# Patient Record
Sex: Female | Born: 1970 | Race: White | Hispanic: No | Smoking: Never smoker
Health system: Southern US, Community
[De-identification: ages and names within clinical notes are randomized; demographics above are authoritative.]

## PROBLEM LIST (undated history)

## (undated) DIAGNOSIS — F329 Major depressive disorder, single episode, unspecified: Secondary | ICD-10-CM

## (undated) DIAGNOSIS — F411 Generalized anxiety disorder: Secondary | ICD-10-CM

## (undated) HISTORY — DX: Major depressive disorder, single episode, unspecified: F32.9

## (undated) HISTORY — PX: OTHER SURGICAL HISTORY: SHX169

## (undated) HISTORY — DX: Generalized anxiety disorder: F41.1

---

## 2012-05-29 LAB — HM MAMMOGRAPHY: HM Mammogram: NORMAL

## 2012-05-29 LAB — HM PAP SMEAR: HM Pap smear: NORMAL

## 2014-04-07 ENCOUNTER — Ambulatory Visit: Payer: Self-pay | Admitting: Family Medicine

## 2014-04-18 ENCOUNTER — Encounter: Payer: Self-pay | Admitting: Medical

## 2014-04-18 ENCOUNTER — Ambulatory Visit (INDEPENDENT_AMBULATORY_CARE_PROVIDER_SITE_OTHER): Payer: BC Managed Care – PPO | Admitting: Medical

## 2014-04-18 VITALS — BP 92/62 | HR 67 | Temp 98.3°F | Ht 66.5 in | Wt 156.6 lb

## 2014-04-18 DIAGNOSIS — F418 Other specified anxiety disorders: Secondary | ICD-10-CM | POA: Insufficient documentation

## 2014-04-18 DIAGNOSIS — F32A Depression, unspecified: Secondary | ICD-10-CM

## 2014-04-18 DIAGNOSIS — IMO0002 Reserved for concepts with insufficient information to code with codable children: Secondary | ICD-10-CM

## 2014-04-18 DIAGNOSIS — R5381 Other malaise: Secondary | ICD-10-CM

## 2014-04-18 DIAGNOSIS — R5383 Other fatigue: Secondary | ICD-10-CM | POA: Insufficient documentation

## 2014-04-18 DIAGNOSIS — F329 Major depressive disorder, single episode, unspecified: Secondary | ICD-10-CM | POA: Insufficient documentation

## 2014-04-18 DIAGNOSIS — F3289 Other specified depressive episodes: Secondary | ICD-10-CM

## 2014-04-18 DIAGNOSIS — M5410 Radiculopathy, site unspecified: Secondary | ICD-10-CM

## 2014-04-18 HISTORY — DX: Depression, unspecified: F32.A

## 2014-04-18 LAB — CBC WITH DIFFERENTIAL/PLATELET
BASOS ABS: 0 10*3/uL (ref 0.0–0.1)
Basophils Relative: 0.4 % (ref 0.0–3.0)
EOS ABS: 0.2 10*3/uL (ref 0.0–0.7)
Eosinophils Relative: 3.6 % (ref 0.0–5.0)
HCT: 38.5 % (ref 36.0–46.0)
HEMOGLOBIN: 12.8 g/dL (ref 12.0–15.0)
Lymphocytes Relative: 26.2 % (ref 12.0–46.0)
Lymphs Abs: 1.3 10*3/uL (ref 0.7–4.0)
MCHC: 33.3 g/dL (ref 30.0–36.0)
MCV: 90.7 fl (ref 78.0–100.0)
MONOS PCT: 6.7 % (ref 3.0–12.0)
Monocytes Absolute: 0.3 10*3/uL (ref 0.1–1.0)
NEUTROS ABS: 3.1 10*3/uL (ref 1.4–7.7)
NEUTROS PCT: 63.1 % (ref 43.0–77.0)
Platelets: 222 10*3/uL (ref 150.0–400.0)
RBC: 4.25 Mil/uL (ref 3.87–5.11)
RDW: 12.8 % (ref 11.5–15.5)
WBC: 4.9 10*3/uL (ref 4.0–10.5)

## 2014-04-18 LAB — COMPLETE METABOLIC PANEL WITH GFR
ALBUMIN: 4.3 g/dL (ref 3.5–5.2)
ALK PHOS: 34 U/L — AB (ref 39–117)
ALT: 8 U/L (ref 0–35)
AST: 13 U/L (ref 0–37)
BUN: 10 mg/dL (ref 6–23)
CALCIUM: 9.3 mg/dL (ref 8.4–10.5)
CHLORIDE: 101 meq/L (ref 96–112)
CO2: 26 mEq/L (ref 19–32)
Creat: 0.56 mg/dL (ref 0.50–1.10)
GFR, Est African American: >89 mL/min
GFR, Est Non African American: >89 mL/min
Glucose, Bld: 82 mg/dL (ref 70–99)
Potassium: 4.2 mEq/L (ref 3.5–5.3)
SODIUM: 136 meq/L (ref 135–145)
TOTAL PROTEIN: 6.5 g/dL (ref 6.0–8.3)
Total Bilirubin: 0.6 mg/dL (ref 0.2–1.2)

## 2014-04-18 LAB — TSH: TSH: 0.76 u[IU]/mL (ref 0.35–4.50)

## 2014-04-18 NOTE — Patient Instructions (Signed)
For your fatigue we will do cbc, cmp and tsh today. For your mood if you continue to feel depression could offer medication and could investigate into counseling services. For your rare transient radicular(shooting pain) left upper and lower extremity pain we could do imaging studies, labs or refer to neurologist depending on associated signs or symptoms. If symptoms change or worsen notify us as that would direct our workup. Follow up in 1-2 months for cpe with Dr. Beverely Low.

## 2014-04-18 NOTE — Assessment & Plan Note (Signed)
Will go ahead and get CBC, CMP and TSH today. This is patient's first time and will see if she is anemic. We'll follow her blood pressure on future visits as well. She is not report any heavy vaginal bleeding or any dark black stools. It is possible her blood pressure is her baseline normal although she reports at her gym her blood pressure is a little bit higher.

## 2014-04-18 NOTE — Progress Notes (Signed)
Subjective:    Patient ID: Danielle Kelly, female    DOB: [birth year], 43 y.o.   MRN: [id number]  HPI  See pmh, psh, fh.  Pt here to switch from [facility]. Pt drinks 2 cups coffee a day. Exercise twice a week. Now walking. Not married. 2 boys. 1 in college. Other in 8th grade. [employer]. 2 yrs [employer] and business school. Pt states her last papsmear has been a while.   Pt states she does feel some level depression. Dad passed awary Jan 19, 2023. Aunt passed away about 2 wks later. Son just left for college. Pt feels sad cries every day. Sleeps about 7 hours. Can't concentrate. No homicidal or suicidal ideations. This is first significant loss. Pt does report some fatigue. Last complete physical more than 1 yr(She thinks.).  Pt also states occasional transient shooting pain on and off lt upper extremity and lt lower extremtiy. Brief last about 3 seconds now.  But states in may was more constant  Sharp pain  off and on for week and around April.  No chest pain, neck pain or shoulder pain. Prior pcp evaluated and thought anxiety related. Her ekg around that time was negative.  Fatigue x 3 months.    Review of Systems  Constitutional: Positive for fatigue. Negative for fever and chills.  HENT: Negative.   Respiratory: Negative for cough, choking, chest tightness and wheezing.   Cardiovascular: Negative for chest pain, palpitations and leg swelling.  Endocrine: Negative for polydipsia and polyphagia.  Genitourinary: Negative.   Neurological: Negative.        Only rare occasional sharp transient pain in left upper extremity and lower extremity. On review she has no neck pain or lumbar pain. No weakness of either extremity. No report of incontinence. No associated neurologic type symptoms. No chest pain with this transient shooting pain. Her EKG done was negative at her prior PCP office. Recently she states that she has not been an issue but she did bring up at the end  of the interview.   Psychiatric/Behavioral: Positive for dysphoric mood. Negative for suicidal ideas, hallucinations, behavioral problems, confusion, sleep disturbance, self-injury and decreased concentration. The patient is not nervous/anxious.        She does admit to some depression but does not want to be on any medications. She thinks this is somewhat normal variant due to the death of her father. As well as other family members passing and her son leaving for college. Her mild depression has been present since May.       Objective:   Physical Exam  Constitutional: She is oriented to person, place, and time. She appears well-developed and well-nourished. No distress.  HENT:  Head: Normocephalic and atraumatic.  Eyes: Conjunctivae and EOM are normal. Pupils are equal, round, and reactive to light.  Neck: Normal range of motion. Neck supple. No tracheal deviation present. No thyromegaly present.  Cardiovascular: Normal rate, regular rhythm and normal heart sounds.   Pulmonary/Chest: Effort normal and breath sounds normal. No stridor. No respiratory distress. She has no wheezes. She has no rales. She exhibits no tenderness.  Abdominal: She exhibits no distension and no mass. There is no tenderness. There is no rebound and no guarding.  Musculoskeletal:  Left upper extremity-negative Phalen's and Finkelstein test. She exhibits good range of motion of the shoulder and elbow with no pain. Radial and ulnar pulse intact.  Lower back-no mid line lumbar tenderness to palpation. No CVA tenderness.  Lymphadenopathy:  She has no cervical adenopathy.  Neurological: She is alert and oriented to person, place, and time. No cranial nerve deficit. Coordination normal.  Skin: Skin is warm and dry. She is not diaphoretic. No erythema. No pallor.  Psychiatric: She has a normal mood and affect. Her behavior is normal. Judgment and thought content normal.  No flat affect. Pleasant today and does not come  across as overly depressed.            Assessment & Plan:

## 2014-04-18 NOTE — Assessment & Plan Note (Signed)
The patient describes somewhat atypical sharp transient pain in the left upper extremity and left lower extremity. This was more prominent in April and May. Has subsided a great deal and is very rare now. Presently I want patient to monitor frequency duration and associated signs and symptoms. Would consider workup depending on how this progresses. Recently it has been very minimal/rare.

## 2014-04-18 NOTE — Assessment & Plan Note (Signed)
Patient first visit here and she is not open to the idea of medication today. I did advise her on the length of her decreased mood from May until now. I expressed to her that that was good length of time and her depressed mood were to continue I think a good idea would be short course of low dose SSRI. In addition if this persists would offer referral for counseling.

## 2014-05-29 ENCOUNTER — Encounter: Payer: Self-pay | Admitting: Family Medicine

## 2014-05-29 ENCOUNTER — Ambulatory Visit (INDEPENDENT_AMBULATORY_CARE_PROVIDER_SITE_OTHER): Payer: BC Managed Care – PPO | Admitting: Family Medicine

## 2014-05-29 VITALS — BP 120/80 | HR 64 | Temp 98.1°F | Resp 16 | Wt 161.1 lb

## 2014-05-29 DIAGNOSIS — F32A Depression, unspecified: Secondary | ICD-10-CM

## 2014-05-29 DIAGNOSIS — R253 Fasciculation: Secondary | ICD-10-CM

## 2014-05-29 DIAGNOSIS — F329 Major depressive disorder, single episode, unspecified: Secondary | ICD-10-CM

## 2014-05-29 NOTE — Progress Notes (Signed)
   Subjective:    Patient ID: Danielle Kelly, female    DOB: [birth year], 43 y.o.   MRN: [id number]  HPI Depression- pt lost father, aunt, and oldest son left for college all this summer.  Pt [facility].  Pt is not interested in idea of medication.  Pt largest stressor was dealing w/ mom after dad passed.  Pt has very busy schedule.  'i want to grieve and be done w/ it'.  Pt reports things are improving, 'i'm just super sad'.  Pt reports she is able to get through her day and get done what she needs to get done.  'it annoys me that I cry so much'.  L eye spasm- pt reports that 'it pulls like it's closing'.  Will occur on both upper and lower lid.  'feels like it's getting pulled back and then releases'.  sxs will occur multiple times daily, lasting briefly for a few seconds.  Review of Systems For ROS see HPI     Objective:   Physical Exam  Vitals reviewed. Constitutional: She is oriented to person, place, and time. She appears well-developed and well-nourished. No distress.  HENT:  Head: Normocephalic and atraumatic.  Eyes: Conjunctivae and EOM are normal. Pupils are equal, round, and reactive to light. Right eye exhibits no discharge. Left eye exhibits no discharge.  Cardiovascular: Normal rate, regular rhythm, normal heart sounds and intact distal pulses.   Pulmonary/Chest: Effort normal and breath sounds normal. No respiratory distress. She has no wheezes. She has no rales.  Neurological: She is alert and oriented to person, place, and time. No cranial nerve deficit. Coordination normal.  Psychiatric: She has a normal mood and affect. Her behavior is normal. Thought content normal.          Assessment & Plan:

## 2014-05-29 NOTE — Assessment & Plan Note (Signed)
New.  Suspect this is stress related.  Encouraged pt to find stress outlet and rest.  Reviewed recent labs- WNL.  No need for additional testing at this time.

## 2014-05-29 NOTE — Assessment & Plan Note (Signed)
Pt reports doing better since last visit.  Still going through the grieving process.  Not interested in medication.  Doesn't want to continue grief counseling at this time.  Reviewed that grief itself is not pathologic but she needs to be aware that if bad days outnumber good or grief is interfering w/ daily function, we should consider meds.  Pt expressed understanding and is in agreement w/ plan.

## 2014-05-29 NOTE — Progress Notes (Signed)
Pre visit review using our clinic review tool, if applicable. No additional management support is needed unless otherwise documented below in the visit note. 

## 2014-05-29 NOTE — Patient Instructions (Signed)
Follow up as scheduled for your physical Grief is a normal process and can take up to a year to fully navigate- this is normal! If you find that you are having more bad days than good days or the sadness is interfering w/ your day to day functioning, please call me! Call with any questions or concerns Hang in there!  You're stronger than you give yourself credit for!

## 2014-07-26 ENCOUNTER — Encounter: Payer: BC Managed Care – PPO | Admitting: Family Medicine

## 2015-05-29 ENCOUNTER — Ambulatory Visit (INDEPENDENT_AMBULATORY_CARE_PROVIDER_SITE_OTHER): Payer: BLUE CROSS/BLUE SHIELD | Admitting: Family Medicine

## 2015-05-29 ENCOUNTER — Encounter: Payer: Self-pay | Admitting: Family Medicine

## 2015-05-29 VITALS — BP 122/82 | HR 65 | Temp 98.5°F | Resp 18 | Ht 66.5 in | Wt 164.0 lb

## 2015-05-29 DIAGNOSIS — M792 Neuralgia and neuritis, unspecified: Secondary | ICD-10-CM

## 2015-05-29 DIAGNOSIS — F411 Generalized anxiety disorder: Secondary | ICD-10-CM | POA: Insufficient documentation

## 2015-05-29 DIAGNOSIS — Z1239 Encounter for other screening for malignant neoplasm of breast: Secondary | ICD-10-CM | POA: Diagnosis not present

## 2015-05-29 DIAGNOSIS — R079 Chest pain, unspecified: Secondary | ICD-10-CM | POA: Insufficient documentation

## 2015-05-29 DIAGNOSIS — R5383 Other fatigue: Secondary | ICD-10-CM | POA: Diagnosis not present

## 2015-05-29 DIAGNOSIS — Z0000 Encounter for general adult medical examination without abnormal findings: Secondary | ICD-10-CM | POA: Insufficient documentation

## 2015-05-29 DIAGNOSIS — F329 Major depressive disorder, single episode, unspecified: Secondary | ICD-10-CM

## 2015-05-29 DIAGNOSIS — M5410 Radiculopathy, site unspecified: Secondary | ICD-10-CM

## 2015-05-29 DIAGNOSIS — R0789 Other chest pain: Secondary | ICD-10-CM | POA: Diagnosis not present

## 2015-05-29 DIAGNOSIS — F32A Depression, unspecified: Secondary | ICD-10-CM

## 2015-05-29 HISTORY — DX: Generalized anxiety disorder: F41.1

## 2015-05-29 HISTORY — DX: Chest pain, unspecified: R07.9

## 2015-05-29 LAB — CBC WITH DIFFERENTIAL/PLATELET
BASOS PCT: 0.5 % (ref 0.0–3.0)
Basophils Absolute: 0 10*3/uL (ref 0.0–0.1)
EOS ABS: 0.2 10*3/uL (ref 0.0–0.7)
Eosinophils Relative: 2.9 % (ref 0.0–5.0)
HEMATOCRIT: 40.4 % (ref 36.0–46.0)
Hemoglobin: 13.4 g/dL (ref 12.0–15.0)
LYMPHS ABS: 1.8 10*3/uL (ref 0.7–4.0)
LYMPHS PCT: 26.5 % (ref 12.0–46.0)
MCHC: 33.2 g/dL (ref 30.0–36.0)
MCV: 90.5 fl (ref 78.0–100.0)
MONO ABS: 0.4 10*3/uL (ref 0.1–1.0)
Monocytes Relative: 6.1 % (ref 3.0–12.0)
NEUTROS ABS: 4.4 10*3/uL (ref 1.4–7.7)
Neutrophils Relative %: 64 % (ref 43.0–77.0)
PLATELETS: 256 10*3/uL (ref 150.0–400.0)
RBC: 4.47 Mil/uL (ref 3.87–5.11)
RDW: 12.9 % (ref 11.5–15.5)
WBC: 6.9 10*3/uL (ref 4.0–10.5)

## 2015-05-29 LAB — COMPREHENSIVE METABOLIC PANEL
ALT: 10 U/L (ref 0–35)
AST: 13 U/L (ref 0–37)
Albumin: 4.5 g/dL (ref 3.5–5.2)
Alkaline Phosphatase: 42 U/L (ref 39–117)
BUN: 12 mg/dL (ref 6–23)
CALCIUM: 9.7 mg/dL (ref 8.4–10.5)
CHLORIDE: 102 meq/L (ref 96–112)
CO2: 31 meq/L (ref 19–32)
CREATININE: 0.49 mg/dL (ref 0.40–1.20)
GFR: 145.66 mL/min (ref >60.00)
Glucose, Bld: 86 mg/dL (ref 70–99)
Potassium: 3.9 mEq/L (ref 3.5–5.1)
Sodium: 139 mEq/L (ref 135–145)
Total Bilirubin: 0.6 mg/dL (ref 0.2–1.2)
Total Protein: 7.1 g/dL (ref 6.0–8.3)

## 2015-05-29 LAB — TSH: TSH: 1.25 u[IU]/mL (ref 0.35–4.50)

## 2015-05-29 LAB — VITAMIN D 25 HYDROXY (VIT D DEFICIENCY, FRACTURES): VITD: 15.47 ng/mL — ABNORMAL LOW (ref 30.00–100.00)

## 2015-05-29 LAB — VITAMIN B12: Vitamin B-12: 446 pg/mL (ref 211–911)

## 2015-05-29 MED ORDER — LORAZEPAM 0.5 MG PO TABS
0.5000 mg | ORAL_TABLET | Freq: Two times a day (BID) | ORAL | Status: DC | PRN
Start: 2015-05-29 — End: 2018-01-14

## 2015-05-29 NOTE — Progress Notes (Signed)
Pre visit review using our clinic review tool, if applicable. No additional management support is needed unless otherwise documented below in the visit note. 

## 2015-05-29 NOTE — Patient Instructions (Signed)
Health Maintenance, Female Adopting a healthy lifestyle and getting preventive care can go a long way to promote health and wellness. Talk with your health care provider about what schedule of regular examinations is right for you. This is a good chance for you to check in with your provider about disease prevention and staying healthy. In between checkups, there are plenty of things you can do on your own. Experts have done a lot of research about which lifestyle changes and preventive measures are most likely to keep you healthy. Ask your health care provider for more information. WEIGHT AND DIET  Eat a healthy diet  Be sure to include plenty of vegetables, fruits, low-fat dairy products, and lean protein.  Do not eat a lot of foods high in solid fats, added sugars, or salt.  Get regular exercise. This is one of the most important things you can do for your health.  Most adults should exercise for at least 150 minutes each week. The exercise should increase your heart rate and make you sweat (moderate-intensity exercise).  Most adults should also do strengthening exercises at least twice a week. This is in addition to the moderate-intensity exercise.  Maintain a healthy weight  Body mass index (BMI) is a measurement that can be used to identify possible weight problems. It estimates body fat based on height and weight. Your health care provider can help determine your BMI and help you achieve or maintain a healthy weight.  For females 31 years of age and older:   A BMI below 18.5 is considered underweight.  A BMI of 18.5 to 24.9 is normal.  A BMI of 25 to 29.9 is considered overweight.  A BMI of 30 and above is considered obese.  Watch levels of cholesterol and blood lipids  You should start having your blood tested for lipids and cholesterol at 44 years of age, then have this test every 5 years.  You may need to have your cholesterol levels checked more often if:  Your lipid  or cholesterol levels are high.  You are older than 44 years of age.  You are at high risk for heart disease.  CANCER SCREENING   Lung Cancer  Lung cancer screening is recommended for adults 28-85 years old who are at high risk for lung cancer because of a history of smoking.  A yearly low-dose CT scan of the lungs is recommended for people who:  Currently smoke.  Have quit within the past 15 years.  Have at least a 30-pack-year history of smoking. A pack year is smoking an average of one pack of cigarettes a day for 1 year.  Yearly screening should continue until it has been 15 years since you quit.  Yearly screening should stop if you develop a health problem that would prevent you from having lung cancer treatment.  Breast Cancer  Practice breast self-awareness. This means understanding how your breasts normally appear and feel.  It also means doing regular breast self-exams. Let your health care provider know about any changes, no matter how small.  If you are in your 20s or 30s, you should have a clinical breast exam (CBE) by a health care provider every 1-3 years as part of a regular health exam.  If you are 103 or older, have a CBE every year. Also consider having a breast X-ray (mammogram) every year.  If you have a family history of breast cancer, talk to your health care provider about genetic screening.  If you  are at high risk for breast cancer, talk to your health care provider about having an MRI and a mammogram every year.  Breast cancer gene (BRCA) assessment is recommended for women who have family members with BRCA-related cancers. BRCA-related cancers include:  Breast.  Ovarian.  Tubal.  Peritoneal cancers.  Results of the assessment will determine the need for genetic counseling and BRCA1 and BRCA2 testing. Cervical Cancer Your health care provider may recommend that you be screened regularly for cancer of the pelvic organs (ovaries, uterus, and  vagina). This screening involves a pelvic examination, including checking for microscopic changes to the surface of your cervix (Pap test). You may be encouraged to have this screening done every 3 years, beginning at age 21.  For women ages 30-65, health care providers may recommend pelvic exams and Pap testing every 3 years, or they may recommend the Pap and pelvic exam, combined with testing for human papilloma virus (HPV), every 5 years. Some types of HPV increase your risk of cervical cancer. Testing for HPV may also be done on women of any age with unclear Pap test results.  Other health care providers may not recommend any screening for nonpregnant women who are considered low risk for pelvic cancer and who do not have symptoms. Ask your health care provider if a screening pelvic exam is right for you.  If you have had past treatment for cervical cancer or a condition that could lead to cancer, you need Pap tests and screening for cancer for at least 20 years after your treatment. If Pap tests have been discontinued, your risk factors (such as having a new sexual partner) need to be reassessed to determine if screening should resume. Some women have medical problems that increase the chance of getting cervical cancer. In these cases, your health care provider may recommend more frequent screening and Pap tests. Colorectal Cancer  This type of cancer can be detected and often prevented.  Routine colorectal cancer screening usually begins at 44 years of age and continues through 44 years of age.  Your health care provider may recommend screening at an earlier age if you have risk factors for colon cancer.  Your health care provider may also recommend using home test kits to check for hidden blood in the stool.  A small camera at the end of a tube can be used to examine your colon directly (sigmoidoscopy or colonoscopy). This is done to check for the earliest forms of colorectal  cancer.  Routine screening usually begins at age 50.  Direct examination of the colon should be repeated every 5-10 years through 44 years of age. However, you may need to be screened more often if early forms of precancerous polyps or small growths are found. Skin Cancer  Check your skin from head to toe regularly.  Tell your health care provider about any new moles or changes in moles, especially if there is a change in a mole's shape or color.  Also tell your health care provider if you have a mole that is larger than the size of a pencil eraser.  Always use sunscreen. Apply sunscreen liberally and repeatedly throughout the day.  Protect yourself by wearing long sleeves, pants, a wide-brimmed hat, and sunglasses whenever you are outside. HEART DISEASE, DIABETES, AND HIGH BLOOD PRESSURE   High blood pressure causes heart disease and increases the risk of stroke. High blood pressure is more likely to develop in:  People who have blood pressure in the high end   of the normal range (130-139/85-89 mm Hg).  People who are overweight or obese.  People who are African American.  If you are 38-23 years of age, have your blood pressure checked every 3-5 years. If you are 61 years of age or older, have your blood pressure checked every year. You should have your blood pressure measured twice--once when you are at a hospital or clinic, and once when you are not at a hospital or clinic. Record the average of the two measurements. To check your blood pressure when you are not at a hospital or clinic, you can use:  An automated blood pressure machine at a pharmacy.  A home blood pressure monitor.  If you are between 45 years and 39 years old, ask your health care provider if you should take aspirin to prevent strokes.  Have regular diabetes screenings. This involves taking a blood sample to check your fasting blood sugar level.  If you are at a normal weight and have a low risk for diabetes,  have this test once every three years after 44 years of age.  If you are overweight and have a high risk for diabetes, consider being tested at a younger age or more often. PREVENTING INFECTION  Hepatitis B  If you have a higher risk for hepatitis B, you should be screened for this virus. You are considered at high risk for hepatitis B if:  You were born in a country where hepatitis B is common. Ask your health care provider which countries are considered high risk.  Your parents were born in a high-risk country, and you have not been immunized against hepatitis B (hepatitis B vaccine).  You have HIV or AIDS.  You use needles to inject street drugs.  You live with someone who has hepatitis B.  You have had sex with someone who has hepatitis B.  You get hemodialysis treatment.  You take certain medicines for conditions, including cancer, organ transplantation, and autoimmune conditions. Hepatitis C  Blood testing is recommended for:  Everyone born from [birth year] through 1965.  Anyone with known risk factors for hepatitis C. Sexually transmitted infections (STIs)  You should be screened for sexually transmitted infections (STIs) including gonorrhea and chlamydia if:  You are sexually active and are younger than 44 years of age.  You are older than 44 years of age and your health care provider tells you that you are at risk for this type of infection.  Your sexual activity has changed since you were last screened and you are at an increased risk for chlamydia or gonorrhea. Ask your health care provider if you are at risk.  If you do not have HIV, but are at risk, it may be recommended that you take a prescription medicine daily to prevent HIV infection. This is called pre-exposure prophylaxis (PrEP). You are considered at risk if:  You are sexually active and do not regularly use condoms or know the HIV status of your partner(s).  You take drugs by injection.  You are sexually  active with a partner who has HIV. Talk with your health care provider about whether you are at high risk of being infected with HIV. If you choose to begin PrEP, you should first be tested for HIV. You should then be tested every 3 months for as long as you are taking PrEP.  PREGNANCY   If you are premenopausal and you may become pregnant, ask your health care provider about preconception counseling.  If you may  become pregnant, take 400 to 800 micrograms (mcg) of folic acid every day.  If you want to prevent pregnancy, talk to your health care provider about birth control (contraception). OSTEOPOROSIS AND MENOPAUSE   Osteoporosis is a disease in which the bones lose minerals and strength with aging. This can result in serious bone fractures. Your risk for osteoporosis can be identified using a bone density scan.  If you are 61 years of age or older, or if you are at risk for osteoporosis and fractures, ask your health care provider if you should be screened.  Ask your health care provider whether you should take a calcium or vitamin D supplement to lower your risk for osteoporosis.  Menopause may have certain physical symptoms and risks.  Hormone replacement therapy may reduce some of these symptoms and risks. Talk to your health care provider about whether hormone replacement therapy is right for you.  HOME CARE INSTRUCTIONS   Schedule regular health, dental, and eye exams.  Stay current with your immunizations.   Do not use any tobacco products including cigarettes, chewing tobacco, or electronic cigarettes.  If you are pregnant, do not drink alcohol.  If you are breastfeeding, limit how much and how often you drink alcohol.  Limit alcohol intake to no more than 1 drink per day for nonpregnant women. One drink equals 12 ounces of beer, 5 ounces of wine, or 1 ounces of hard liquor.  Do not use street drugs.  Do not share needles.  Ask your health care provider for help if  you need support or information about quitting drugs.  Tell your health care provider if you often feel depressed.  Tell your health care provider if you have ever been abused or do not feel safe at home.   This information is not intended to replace advice given to you by your health care provider. Make sure you discuss any questions you have with your health care provider.   Document Released: 02/17/2011 Document Revised: 08/25/2014 Document Reviewed: 07/06/2013 Elsevier Interactive Patient Education [employer].

## 2015-05-29 NOTE — Progress Notes (Addendum)
Subjective:    Patient ID: Danielle Kelly, female    DOB: 1971-01-23, 44 y.o.   MRN: [id number]  HPI   Patient presents for transfer of care with multiple complaints.  Anxiety: Patient spoke to prior providers about her depression and anxiety. In the past she has not felt the need to try a medication, and has been able to work through her problems. She had lost her father approximately a year and a half ago, and is having some increased anxiety surrounding some family matters currently. She has 2 young sons, one is away at college, and she states she does worry about and sometimes as well.  Chest pain/neck pain/arm pain: Patient states she's been experiencing intermittent chest pain over the last 2 weeks. She reports that sometimes she feels like she has shooting pains down her arm, and into her jaw. She jogs everyday, and doesn't seem to experience these pains which are getting. She has noticed them intermittently, and is not associated with each other or exertion. She states yesterday she felt more jaw pain and radiating to her neck, so she sat down and she felt better within 10 minutes. She has felt like her right arm is sore at times as well. She denies any trauma or injury. She denies any prior neck injury. Per chart, she's had left upper extremity radiculopathy that resolved on its own in no imaging studies were obtained.   Health maintenance:  Colonoscopy: Screening to start at 27, unknown family hx of what type. Mammogram: Prior mammogram has been normal per patient. Unknown fhx. Mammogram is due. Cervical cancer screening: GYN 2013, [facility].  Immunizations: Flu shot UTD. Tdap unknown. Records requested.  Infectious disease screening: Unknown, records requested.   Past Medical History  Diagnosis Date  . Depression 04/18/2014   No Known Allergies Past Surgical History  Procedure Laterality Date  . Lbtl Bilateral    Family History  Problem Relation Age of Onset    . Kidney disease Father   . Heart disease Father   . Diabetes Father    Social History   Social History  . Marital Status: Divorced    Spouse Name: N/A  . Number of Children: N/A  . Years of Education: N/A   Occupational History  . Not on file.   Social History Main Topics  . Smoking status: Never Smoker   . Smokeless tobacco: Never Used  . Alcohol Use: Yes  . Drug Use: No  . Sexual Activity: Not on file   Other Topics Concern  . Not on file   Social History Narrative    Review of Systems Negative, with the exception of above mentioned in HPI     Objective:   Physical Exam BP 122/82 mmHg  Pulse 65  Temp(Src) 98.5 F (36.9 C) (Temporal)  Resp 18  Ht 5' 6.5" (1.689 m)  Wt 164 lb (74.39 kg)  BMI 26.08 kg/m2  SpO2 100% Gen: Afebrile. No acute distress. Nontoxic in appearance, well-developed, well-nourished Caucasian female. Very pleasant. HENT: AT. Nora. Bilateral TM visualized and normal in appearance. MMM. Bilateral nares without erythema or swelling. Throat without erythema or exudates.  Eyes:Pupils Equal Round Reactive to light, Extraocular movements intact,  Conjunctiva without redness, discharge or icterus. Neck/lymp/endocrine: Supple, no lymphadenopathy, no  thyromegaly CV: RRR no murmurs appreciated, noa, +2/4 P posterior tibialis pulses Chest: CTAB, no wheeze or crackles Abd: Soft. Flat. NTND. BS present. no  Masses palpated.  Skin: No  rashes, purpura or  petechiae.  Neuro/MSK: Normal gait. PERLA. EOMi. Alert. Oriented. Cranial nerves II through XII intact. Muscle strength 5/5 upper and lower  extremity. Mild weakness left empty can test. Negative Hawkins, negative O'Brien's bilaterally. Negative liftoff test. No tenderness to palpation bilateral upper trapezius. The paraspinal fullness. No bony tenderness of the cervical thoracic spine.Marland Kitchen Psych: Normal affect, dress and demeanor. Normal speech. Normal thought content and judgment.  EKG: NSR. HR 61. No ST  changed or Q waves. Normal R-Wave progression.      Assessment & Plan:  1. Breast cancer screening - MM DIGITAL SCREENING BILATERAL; Future  2. Other chest pain - EKG today normal. Chest pain is intermittent, atypical. Questionable radiation to jaw. Patient with increased level of anxiety, and may be related to discomfort. Patient jogs daily with discomfort.  Patient to monitor if occurs more frequently, or associated with increase in dizziness, jaw pain/arm pain/shortness of breath would recommend immediate eval and echo.  - EKG 12-Lead - CBC w/Diff - Comprehensive metabolic panel - Lipid panel; Future  3. Depression with anxiety:  - Worsening, pt has had discussions in the past with other providers concerning her depression/anxiety. She has never tried therapy or medications.  - Patient does not desire daily medication.  - After discussion, pt amendable to trying ativan as needed.  - TSH today. Patient also increased fatigue.  4. Radicular pain - Unknown etiology. No prior history of neck injury. No imaging study obtained in the past. Repeat sx from a few years ago. - encouraged pt to monitor, use NSAID therapy. If worsening then would start with cervical spine xray. Will obtain B12 as well.  - B12  5. Other fatigue - TSH, Vit D, cbc, cmp collected today. Patient also having increased depression which could be playing a role.  - Vitamin D (25 hydroxy)  6. Anxiety state - Increased anxiety surrounding family. Worsening, pt has had discussions in the past with other providers concerning her depression/anxiety. She has never tried therapy or medications. - LORazepam (ATIVAN) 0.5 MG tablet; Take 1 tablet (0.5 mg total) by mouth 2 (two) times daily as needed for anxiety.  Dispense: 30 tablet; Refill: 1  F/U 1 year , unless labs indicate need, anxiety increases.

## 2015-05-30 ENCOUNTER — Telehealth: Payer: Self-pay | Admitting: Family Medicine

## 2015-05-30 MED ORDER — VITAMIN D (ERGOCALCIFEROL) 1.25 MG (50000 UNIT) PO CAPS: 50000.0000 [IU] | ORAL_CAPSULE | ORAL | Status: DC

## 2015-05-30 NOTE — Telephone Encounter (Signed)
Patient aware of results and new Rx medications.  Pt has no questions at this time.

## 2015-05-30 NOTE — Telephone Encounter (Signed)
Please call patient, all of her lab work from yesterday looks normal, with the exception of her vitamin D. - Her vitamin D is extremely low at 15. Ideally we would like to see this level around 40- 50. - I have called in vitamin D supplementation for her to take one time a week, for 12 weeks. After the 12 week mark, we will need to recheck her vitamin D level to make sure she is adequately supplemented. Depending upon the results at that time we may need to prescribe an additional round of supplementation, or she can transition into over-the-counter 800 units daily. Patient should also be encouraged to take in adequate calcium daily, she should between 1000-1200 mg daily. - We will contact her again once we get her cholesterol labs return.

## 2015-06-25 ENCOUNTER — Ambulatory Visit
Admission: RE | Admit: 2015-06-25 | Discharge: 2015-06-25 | Disposition: A | Discharge status: Home / Self Care | Payer: BLUE CROSS/BLUE SHIELD | Source: Ambulatory Visit | Attending: Family Medicine | Admitting: Family Medicine

## 2015-06-25 DIAGNOSIS — Z1239 Encounter for other screening for malignant neoplasm of breast: Secondary | ICD-10-CM

## 2016-08-05 ENCOUNTER — Telehealth: Payer: Self-pay | Admitting: Family Medicine

## 2016-08-05 ENCOUNTER — Emergency Department (HOSPITAL_BASED_OUTPATIENT_CLINIC_OR_DEPARTMENT_OTHER)
Admission: EM | Admit: 2016-08-05 | Discharge: 2016-08-05 | Disposition: A | Discharge status: Home / Self Care | Payer: BLUE CROSS/BLUE SHIELD | Attending: Emergency Medicine | Admitting: Emergency Medicine

## 2016-08-05 ENCOUNTER — Emergency Department (HOSPITAL_BASED_OUTPATIENT_CLINIC_OR_DEPARTMENT_OTHER): Payer: BLUE CROSS/BLUE SHIELD

## 2016-08-05 ENCOUNTER — Encounter (HOSPITAL_BASED_OUTPATIENT_CLINIC_OR_DEPARTMENT_OTHER): Payer: Self-pay | Admitting: *Deleted

## 2016-08-05 DIAGNOSIS — R0789 Other chest pain: Secondary | ICD-10-CM | POA: Diagnosis present

## 2016-08-05 MED ORDER — DICLOFENAC SODIUM 1 % TD GEL: 4.0000 g | Freq: Four times a day (QID) | TRANSDERMAL | 0 refills | Status: DC

## 2016-08-05 MED ORDER — IBUPROFEN 400 MG PO TABS
400.0000 mg | ORAL_TABLET | Freq: Once | ORAL | Status: AC
  Administered 2016-08-05: 400 mg via ORAL
  Filled 2016-08-05: qty 1

## 2016-08-05 NOTE — Telephone Encounter (Signed)
Noted  

## 2016-08-05 NOTE — Telephone Encounter (Signed)
Patient calling to report she was seen by GYN this morning.  She was experiecing chest pains, which she has been experiencing for that last few weeks.  Her GYN has ordered an EKG and [facility] be [facility].  I offered to transfer pt to triage nurse.  However, she declined.  Patient also, declined an appt with PCP as she [place].  She states she will just text PCP as they are personal friends.

## 2016-08-05 NOTE — ED Triage Notes (Signed)
Right sided chest pressure since 10am today.  Reports that she was getting ready to leave her house and then went to the GYN and the pain increased.  Denies N/V.  Reports slight difficulty with deep inspiration.  Radiation down right arm.  Pain relieved by deep palpation.

## 2016-08-05 NOTE — Telephone Encounter (Signed)
Please call pt: - she should be seen in the ED. I do not have an opening today to appropriately address this issue. She needs urgent care for her chest pain in order to get results from labs and EKG, in order to treat appropriately. Please apologize to her for me, but she is better going to urgent/emergent care.

## 2016-08-05 NOTE — Telephone Encounter (Signed)
Patient calling office for third time asking what she needs to do about your chest pain.  Patient states triage nurse advised her to call 911 but she states she was driving at the time and was not going to call 911.  Patient asking what pcp wants her to do.

## 2016-08-05 NOTE — Telephone Encounter (Signed)
[facility] - Client TELEPHONE ADVICE RECORD [facility] Patient Name: Danielle GibbsCYENTHIA Kelly DOB: 08-07-1971 Initial Comment Caller states, she has chest pain - for a few weeks off and on. Before today she had neck and chest pain - right side pain, jaw pain. Had nausea last night. Verified Nurse Assessment Nurse: Leveda AnnaHensel, RN, Aeriel Date/Time (Eastern Time): 08/05/2016 12:01:09 PM Confirm and document reason for call. If symptomatic, describe symptoms. ---Caller states, she has chest pain - for a few weeks off and on. Before today she had neck and chest pain - right side pain, jaw pain. A few nights ago she had some nausea. She has some chest pain right now. It is really strong today. Caller states, her neck and back are also bothering her. Does the patient have any new or worsening symptoms? ---Yes Will a triage be completed? ---Yes Related visit to physician within the last 2 weeks? ---No Does the PT have any chronic conditions? (i.e. diabetes, asthma, etc.) ---No Is the patient pregnant or possibly pregnant? (Ask all females between the ages of 3112-55) ---No Is this a behavioral health or substance abuse call? ---No Guidelines Guideline Title Affirmed Question Affirmed Notes Chest Pain [1] Chest pain lasts > 5 minutes AND [2] described as crushing, pressure-like, or heavy Final Disposition User Call EMS 911 Now Hensel, RN, Aeriel Referrals GO TO FACILITY REFUSED Disagree/Comply: Disagree Disagree/Comply Reason: Disagree with instructions

## 2016-08-05 NOTE — Telephone Encounter (Signed)
Called patient to review instructions from Dr. Claiborne BillingsKuneff.  Instructions were gone over in detail with patient and instructing her the importance of being seen at the ER. Patient was instructed by our office and the triage nurse to go to ER   Patient refused to go to ER

## 2016-08-05 NOTE — ED Provider Notes (Signed)
[facility] Provider Note   CSN: [id number] Arrival date & time: 08/05/16  1338     History   Chief Complaint Chief Complaint  Patient presents with  . Chest Pain    HPI Danielle Kelly is a 45 y.o. female.  The history is provided by the patient.  Chest Pain   This is a new problem. Episode onset: 2 weeks. The problem occurs daily. The problem has been gradually worsening. The pain is associated with movement, breathing and raising an arm (started the week after thanksgiving aftr hanging christmas lights). The pain is present in the lateral region. The pain is at a severity of 6/10. The pain is moderate. The quality of the pain is described as sharp and pleuritic. The pain radiates to the right arm, right neck and upper back. Duration of episode(s) is 2 weeks. The symptoms are aggravated by deep breathing and certain positions. Pertinent negatives include no cough, no dizziness, no exertional chest pressure, no fever, no irregular heartbeat, no leg pain, no lower extremity edema, no nausea, no palpitations, no shortness of breath, no sputum production, no syncope and no vomiting. She has tried nothing for the symptoms. The treatment provided no relief. There are no known risk factors.  Pertinent negatives for past medical history include no CHF, no diabetes, no DVT, no hyperlipidemia, no hypertension, no PE and no stimulant use. Past medical history comments: does not use OCP  Her family medical history is significant for hypertension.  Pertinent negatives for family medical history include: no CAD, no PE and no sudden death.    Past Medical History:  Diagnosis Date  . Anxiety state 05/29/2015  . Depression 04/18/2014    Patient Active Problem List   Diagnosis Date Noted  . Breast cancer screening 05/29/2015  . Chest pain 05/29/2015  . Anxiety state 05/29/2015  . Health care maintenance 05/29/2015  . Fasciculations of muscle 05/29/2014  . Depression 04/18/2014    . Fatigue 04/18/2014  . Radicular pain 04/18/2014    Past Surgical History:  Procedure Laterality Date  . LBTL Bilateral     OB History    Gravida Para Term Preterm AB Living   2 2       2    SAB TAB Ectopic Multiple Live Births                   Home Medications    Prior to Admission medications   Medication Sig Start Date End Date Taking? Authorizing Provider  LORazepam (ATIVAN) 0.5 MG tablet Take 1 tablet (0.5 mg total) by mouth 2 (two) times daily as needed for anxiety. 05/29/15   Renee A Kuneff, DO  Vitamin D, Ergocalciferol, (DRISDOL) 50000 UNITS CAPS capsule Take 1 capsule (50,000 Units total) by mouth every 7 (seven) days. 05/30/15   Renee A Claiborne Billings, DO    Family History Family History  Problem Relation Age of Onset  . Kidney disease Father   . Heart disease Father   . Diabetes Father     Social History Social History  Substance Use Topics  . Smoking status: Never Smoker  . Smokeless tobacco: Never Used  . Alcohol use Yes     Comment: Social      Allergies   Patient has no known allergies.   Review of Systems Review of Systems  Constitutional: Negative for fever.  Respiratory: Negative for cough, sputum production and shortness of breath.   Cardiovascular: Positive for chest pain. Negative for palpitations and  syncope.  Gastrointestinal: Negative for nausea and vomiting.  Neurological: Negative for dizziness.  All other systems reviewed and are negative.    Physical Exam Updated Vital Signs BP 117/72 (BP Location: Right Arm)   Pulse 80   Temp 98.3 F (36.8 C) (Oral)   Resp 18   Ht 5\' 7"  (1.702 m)   Wt 165 lb (74.8 kg)   SpO2 100%   BMI 25.84 kg/m   Physical Exam  Constitutional: She is oriented to person, place, and time. She appears well-developed and well-nourished. No distress.  HENT:  Head: Normocephalic and atraumatic.  Mouth/Throat: Oropharynx is clear and moist.  Eyes: Conjunctivae and EOM are normal. Pupils are equal, round,  and reactive to light.  Neck: Normal range of motion. Neck supple.  Cardiovascular: Normal rate, regular rhythm and intact distal pulses.   No murmur heard. Pulses equal in bilateral upper ext  Pulmonary/Chest: Effort normal and breath sounds normal. No respiratory distress. She has no wheezes. She has no rales. She exhibits tenderness.    Abdominal: Soft. She exhibits no distension. There is no tenderness. There is no rebound and no guarding.  Musculoskeletal: Normal range of motion. She exhibits no edema or tenderness.       Back:  No calf pain or swelling  Neurological: She is alert and oriented to person, place, and time.  Skin: Skin is warm and dry. No rash noted. No erythema.  Psychiatric: She has a normal mood and affect. Her behavior is normal.  Nursing note and vitals reviewed.    ED Treatments / Results  Labs (all labs ordered are listed, but only abnormal results are displayed) Labs Reviewed - No data to display  EKG  EKG Interpretation  Date/Time:  Tuesday August 05 2016 13:46:38 EST Ventricular Rate:  76 PR Interval:  188 QRS Duration: 84 QT Interval:  366 QTC Calculation: 411 R Axis:   101 Text Interpretation:  Normal sinus rhythm Rightward axis No previous tracing Confirmed by Anitra LauthPLUNKETT  MD, Alphonzo LemmingsWHITNEY ([id number]) on 08/05/2016 1:53:24 PM       Radiology Dg Chest 2 View  Result Date: 08/05/2016 CLINICAL DATA:  Right-sided chest pain and chest pressure beginning this morning. EXAM: CHEST  2 VIEW COMPARISON:  None. FINDINGS: Heart size is normal. Mediastinal shadows are normal. The lungs are clear. No bronchial thickening. No infiltrate, mass, effusion or collapse. Pulmonary vascularity is normal. No bony abnormality. IMPRESSION: Normal chest Electronically Signed   By: Paulina FusiMark  Shogry M.D.   On: 08/05/2016 14:52    Procedures Procedures (including critical care time)  Medications Ordered in ED Medications  ibuprofen (ADVIL,MOTRIN) tablet 400 mg (400 mg Oral  Given 08/05/16 1446)     Initial Impression / Assessment and Plan / ED Course  I have reviewed the triage vital signs and the nursing notes.  Pertinent labs & imaging results that were available during my care of the patient were reviewed by me and considered in my medical decision making (see chart for details).  Clinical Course    Pt with atypical story for CP which seems to be MSK after hanging christmas lights will reproducible sx.  Pt has not taken anything for the pain but o/w health and takes no meds including OCP's.  PERC neg.  Hear score 0.  EKG showed rightward axis but o/w wnl.  Will get CXR to r/o PTX/mass or other cause however low suspicion for dissection, PE or ACS.  Pt is requesting minimal testing if possible.  No prior  hx of anemia or kidney disease.  If CXR wnl.  Will have pt try a course of ibuprofen and return to pcp if sx worsen.  CXR wnl.  Will d/c home to f/u with PCP.  Final Clinical Impressions(s) / ED Diagnoses   Final diagnoses:  Chest wall pain    New Prescriptions New Prescriptions   DICLOFENAC SODIUM (VOLTAREN) 1 % GEL    Apply 4 g topically 4 (four) times daily.     Gwyneth SproutWhitney Sadee Osland, MD 08/05/16 [phone]

## 2016-08-05 NOTE — Telephone Encounter (Signed)
[facility] nurse called & advised that patient is requesting an appointment with Dr. Claiborne BillingsKuneff for chest pains. Please call patient.

## 2016-08-05 NOTE — Telephone Encounter (Signed)
Noted.  This has been addressed. I believe there is some confusion secondary to the multiple phone calls from patient to office and triage. Each has been addressed appropriately and pt was encouraged by all to go to ED. She refused. I understand she wants to be seen in the office, but this can not be managed in the office acutely and There is no openings on my schedule today. It is advised she go to emergent care, because this is in her best interest. If she would like to make an appt to discuss she can have an appt at next opening, probably tomorrow. However, I advise she get urgent treatment.

## 2018-01-14 ENCOUNTER — Encounter: Payer: Self-pay | Admitting: Family Medicine

## 2018-01-14 ENCOUNTER — Ambulatory Visit (INDEPENDENT_AMBULATORY_CARE_PROVIDER_SITE_OTHER): Payer: BLUE CROSS/BLUE SHIELD | Admitting: Family Medicine

## 2018-01-14 VITALS — BP 109/71 | HR 73 | Temp 98.3°F | Resp 20 | Ht 67.0 in | Wt 163.2 lb

## 2018-01-14 DIAGNOSIS — S46011A Strain of muscle(s) and tendon(s) of the rotator cuff of right shoulder, initial encounter: Secondary | ICD-10-CM | POA: Diagnosis not present

## 2018-01-14 DIAGNOSIS — G8929 Other chronic pain: Secondary | ICD-10-CM | POA: Diagnosis not present

## 2018-01-14 DIAGNOSIS — M25511 Pain in right shoulder: Secondary | ICD-10-CM

## 2018-01-14 MED ORDER — MELOXICAM 15 MG PO TABS: 15.0000 mg | ORAL_TABLET | Freq: Every day | ORAL | 1 refills | Status: DC

## 2018-01-14 NOTE — Progress Notes (Signed)
Danielle Kelly , Jun 17, 1971, 47 y.o., female MRN: [id number] Patient Care Team    Relationship Specialty Notifications Start End  Natalia Leatherwood, DO PCP - General Family Medicine  05/29/15     Chief Complaint  Patient presents with  . Shoulder Pain    right x 3 months     Subjective: Pt presents for an OV with complaints of right shoulder pain of 3 months duration.  Associated symptoms include comfort surrounding the shoulder socket, weakness.  No radiation of pain. With overuse she feels that her shoulder shakes from weakness.  Pain is worse when laying on it.  She reports she is able to work in the garden without pain, although it feels weak at times.  She denies any known injury of her shoulder or neck.  She denies any surgical history to her right shoulder or neck.  She has been caring for her elderly mother which has dementia and may have strained it.  She has a family history of arthritis.  She has not taken any medications to help with her discomfort.  Depression screen [facility] 2/9 01/14/2018  Decreased Interest 0  Down, Depressed, Hopeless 0  PHQ - 2 Score 0  Altered sleeping 0  Tired, decreased energy 0  Change in appetite 0  Feeling bad or failure about yourself  0  Trouble concentrating 0  Suicidal thoughts 0  PHQ-9 Score 0    Allergies  Allergen Reactions  . Latex Itching   Social History   Tobacco Use  . Smoking status: Never Smoker  . Smokeless tobacco: Never Used  Substance Use Topics  . Alcohol use: Yes    Comment: Social    Past Medical History:  Diagnosis Date  . Anxiety state 05/29/2015  . Depression 04/18/2014   Past Surgical History:  Procedure Laterality Date  . LBTL Bilateral    Family History  Problem Relation Age of Onset  . Kidney disease Father   . Heart disease Father   . Diabetes Father    Allergies as of 01/14/2018      Reactions   Latex Itching      Medication List    as of 01/14/2018 10:09 AM   You have not been  prescribed any medications.     All past medical history, surgical history, allergies, family history, immunizations andmedications were updated in the EMR today and reviewed under the history and medication portions of their EMR.     ROS: Negative, with the exception of above mentioned in HPI   Objective:  BP 109/71 (BP Location: Right Arm, Patient Position: Sitting, Cuff Size: Large)   Pulse 73   Temp 98.3 F (36.8 C)   Resp 20   Ht  (1.702 m)   Wt 163 lb 4 oz (74 kg)   LMP 12/14/2017   SpO2 98%   BMI 25.57 kg/m  Body mass index is 25.57 kg/m. Gen: Afebrile. No acute distress. Nontoxic in appearance, well developed, well nourished.  Very pleasant Danielle Kelly female. MSK: No erythema, no soft tissue swelling.  No cervical bone tenderness.  Tender to palpation right upper bicep and supraspinatus area.  Muscle ropiness right trap.  Normal range of motion right shoulder with mild discomfort after full abduction.  Normal range of motion of neck without discomfort.  Positive  empty can test.  Positive Hawkins.  Positive Liftoff test. NV intact distally.   No exam data present No results found. No results found for this or any  previous visit (from the past 24 hour(s)).  Assessment/Plan: [name] is a 47 y.o. female present for OV for  Chronic right shoulder pain Rotator cuff strain, right, initial encounter/impingement - > 3 months of discomfort, likely strain of rotator. Discussed nsaids, PT, injections and complications. Agreeable to start PT and mobic, if worsening will then proceed with imaging and referrals.  - mobic QD with meal - Ambulatory referral to Physical Therapy at least a few times and then can complete at home if desired.  - F/U 2-4 weeks if not improving, sooner if worsening.    Reviewed expectations re: course of current medical issues.  Discussed self-management of symptoms.  Outlined signs and symptoms indicating need for more acute  intervention.  Patient verbalized understanding and all questions were answered.  Patient received an After-Visit Summary.    No orders of the defined types were placed in this encounter.    Note is dictated utilizing voice recognition software. Although note has been proof read prior to signing, occasional typographical errors still can be missed. If any questions arise, please do not hesitate to call for verification.   electronically signed by:  Felix Pacini, DO  [facility]

## 2018-01-14 NOTE — Patient Instructions (Signed)
Start mobic once daily with food. This helps decrease inflammation over time, and needs to be taken daily.   Physical therapy at least for a couple times.   If worsening with PT or not getting better, then we will want to investigate further.   Shoulder Impingement Syndrome Shoulder impingement syndrome is a condition that causes pain when connective tissues (tendons) surrounding the shoulder joint become pinched. These tendons are part of the group of muscles and tissues that help to stabilize the shoulder (rotator cuff). Beneath the rotator cuff is a fluid-filled sac (bursa) that allows the muscles and tendons to glide smoothly. The bursa may become swollen or irritated (bursitis). Bursitis, swelling in the rotator cuff tendons, or both conditions can decrease how much space is under a bone in the shoulder joint (acromion), resulting in impingement. What are the causes? Shoulder impingement syndrome can be caused by bursitis or swelling of the rotator cuff tendons, which may result from:  Repetitive overhead arm movements.  Falling onto the shoulder.  Weakness in the shoulder muscles.  What increases the risk? You may be more likely to develop this condition if you are an athlete who participates in:  Sports that involve throwing, such as baseball.  Tennis.  Swimming.  Volleyball.  Some people are also more likely to develop impingement syndrome because of the shape of their acromion bone. What are the signs or symptoms? The main symptom of this condition is pain on the front or side of the shoulder. Pain may:  Get worse when lifting or raising the arm.  Get worse at night.  Wake you up from sleeping.  Feel sharp when the shoulder is moved, and then fade to an ache.  Other signs and symptoms may include:  Tenderness.  Stiffness.  Inability to raise the arm above shoulder level or behind the body.  Weakness.  How is this diagnosed? This condition may be diagnosed  based on:  Your symptoms.  Your medical history.  A physical exam.  Imaging tests, such as: ? X-rays. ? MRI. ? Ultrasound.  How is this treated? Treatment for this condition may include:  Resting your shoulder and avoiding all activities that cause pain or put stress on the shoulder.  Icing your shoulder.  NSAIDs to help reduce pain and swelling.  One or more injections of medicines to numb the area and reduce inflammation.  Physical therapy.  Surgery. This may be needed if nonsurgical treatments have not helped. Surgery may involve repairing the rotator cuff, reshaping the acromion, or removing the bursa.  Follow these instructions at home: Managing pain, stiffness, and swelling  If directed, apply ice to the injured area. ? Put ice in a plastic bag. ? Place a towel between your skin and the bag. ? Leave the ice on for 20 minutes, 2-3 times a day. Activity  Rest and return to your normal activities as told by your health care provider. Ask your health care provider what activities are safe for you.  Do exercises as told by your health care provider. General instructions  Do not use any tobacco products, including cigarettes, chewing tobacco, or e-cigarettes. Tobacco can delay healing. If you need help quitting, ask your health care provider.  Ask your health care provider when it is safe for you to drive.  Take over-the-counter and prescription medicines only as told by your health care provider.  Keep all follow-up visits as told by your health care provider. This is important. How is this prevented?  Give your body time to rest between periods of activity.  Be safe and responsible while being active to avoid falls.  Maintain physical fitness, including strength and flexibility. Contact a health care provider if:  Your symptoms have not improved after 1-2 months of treatment and rest.  You cannot lift your arm away from your body. This information is not  intended to replace advice given to you by your health care provider. Make sure you discuss any questions you have with your health care provider. Document Released: 08/04/2005 Document Revised: 04/10/2016 Document Reviewed: 07/07/2015 Elsevier Interactive Patient Education  2018 [employer]    Rotator Cuff Tear  Ask your health care provider which exercises are safe for you. Do exercises exactly as told by your health care provider and adjust them as directed. It is normal to feel mild stretching, pulling, tightness, or discomfort as you do these exercises, but you should stop right away if you feel sudden pain or your pain gets worse. Do not begin these exercises until told by your health care provider. Stretching and range of motion exercises These exercises warm up your muscles and joints and improve the movement and flexibility of your shoulder. These exercises also help to relieve pain, numbness, and tingling. Exercise A: Pendulum  1. Stand near a wall or a surface that you can hold onto for balance. 2. Bend at the waist and let your left / right arm hang straight down. Use your other arm to keep your balance. 3. Relax your arm and shoulder muscles, and move your hips and your trunk so your left / right arm swings freely. Your arm should swing because of the motion of your body, not because you are using your arm or shoulder muscles. 4. Keep moving so your arm swings in the following directions, as told by your health care provider: ? Side to side. ? Forward and backward. ? In clockwise and counterclockwise circles. Repeat __________ times, or for __________ seconds per direction. Complete this exercise __________ times a day. Exercise B: Flexion, seated  1. Sit in a stable chair so your left / right forearm can rest on a flat surface. Your elbow should rest at a height that keeps your upper arm next to your body. 2. Keeping your shoulder relaxed, lean forward at the waist and let  your hand slide forward. Stop when you feel a stretch in your shoulder, or when you reach the angle that is recommended by your health care provider. 3. Hold for __________ seconds. 4. Slowly return to the starting position. Repeat __________ times. Complete this exercise __________ times a day. Exercise C: Flexion, standing  1. Stand and hold a broomstick, a cane, or a similar object. Place your hands a little more than shoulder-width apart on the object. Your left / right hand should be palm-up, and your other hand should be palm-down. 2. Push the stick down with your healthy arm to raise your left / right arm in front of your body, and then over your head. Use your other hand to help move the stick. Stop when you feel a stretch in your shoulder, or when you reach the angle that is recommended by your health care provider. ? Avoid shrugging your shoulder while you raise your arm. Keep your shoulder blade tucked down toward your spine. ? Keep your left / right shoulder muscles relaxed. 3. Hold for __________ seconds. 4. Slowly return to the starting position. Repeat __________ times. Complete this exercise __________ times a  day. Exercise D: Abduction, supine  1. Lie on your back and hold a broomstick, a cane, or a similar object. Place your hands a little more than shoulder-width apart on the object. Your left / right hand should be palm-up, and your other hand should be palm-down. 2. Push the stick to raise your left / right arm out to your side and then over your head. Use your other hand to help move the stick. Stop when you feel a stretch in your shoulder, or when you reach the angle that is recommended by your health care provider. ? Avoid shrugging your shoulder while you raise your arm. Keep your shoulder blade tucked down toward your spine. 3. Hold for __________ seconds. 4. Slowly return to the starting position. Repeat __________ times. Complete this exercise __________ times a  day. Exercise E: Shoulder flexion, active-assisted  1. Lie on your back. You may bend your knees for comfort. 2. Hold a broomstick, a cane, or a similar object so your hands are about shoulder-width apart. Your palms should face toward your feet. 3. Raise your left / right arm over your head and behind your head, toward the floor. Use your other hand to help you do this. Stop when you feel a gentle stretch in your shoulder, or when you reach the angle that is recommended by your health care provider. 4. Hold for __________ seconds. 5. Use the broomstick and your other arm to help you return your left / right arm to the starting position. Repeat __________ times. Complete this exercise __________ times a day. Exercise F: External rotation  1. Sit in a stable chair without armrests, or stand. 2. Tuck a soft object, such as a folded towel or a small ball, under your left / right upper arm. 3. Hold a broomstick, a cane, or a similar object so your palms face down, toward the floor. Bend your elbows to an "L" shape (90 degrees), and keep your hands about shoulder-width apart. 4. Straighten your healthy arm and push the broomstick across your body, toward your left / right side. Keep your left / right arm bent. This will rotate your left / right forearm away from your body. 5. Hold for __________ seconds. 6. Slowly return to the starting position. Repeat __________ times. Complete this exercise __________ times a day. Strengthening exercises These exercises build strength and endurance in your shoulder. Endurance is the ability to use your muscles for a long time, even after they get tired. Exercise G: Shoulder flexion, isometric  1. Stand or sit about 4-6 inches (10-15 cm) away from a wall with your left / right side facing the wall. 2. Gently make a fist and place your left / right hand on the wall so the top of your fist touches the wall. 3. With your left / right elbow straight, gently press  the top of your fist into the wall. Gradually increase the pressure until you are pressing as hard as you can without shrugging your shoulder. 4. Hold for __________ seconds. 5. Slowly release the tension and relax your muscles completely before you repeat the exercise. Repeat __________ times. Complete this exercise __________ times a day. Exercise H: Shoulder abduction, isometric  1. Stand or sit about 4-6 inches (10-15 cm) away from a wall with your right/left side facing the wall. 2. Bend your left / right elbow and gently press your elbow into the wall as if you are trying to move your arm out to your side. Increase  the pressure gradually until you are pressing as hard as you can without shrugging your shoulder. 3. Hold for __________ seconds. 4. Slowly release the tension and relax your muscles completely before repeating the exercise. Repeat __________ times. Complete this exercise __________ times a day. Exercise I: Internal rotation, isometric  1. Stand or sit in a doorway, facing the door frame. 2. Bend your left / right elbow and place the palm of your hand against the door frame. Only your palm should be touching the frame. Keep your upper arm at your side. 3. Gently press your hand into the door frame, as if you are trying to push your arm toward your abdomen. Do not let your wrist bend. ? Avoid shrugging your shoulder while you press your hand into the door frame. Keep your shoulder blade tucked down toward the middle of your back. 4. Hold for __________ seconds. 5. Slowly release the tension, and relax your muscles completely before you repeat the exercise. Repeat __________ times. Complete this exercise __________ times a day. Exercise J: External rotation, isometric  1. Stand or sit in a doorway, facing the door frame. 2. Bend your left / right elbow and place the back of your wrist against the door frame. Only the back of your wrist should be touching the frame. Keep your  upper arm at your side. 3. Gently press your wrist against the door frame, as if you are trying to push your arm away from your abdomen. ? Avoid shrugging your shoulder while you press your wrist into the door frame. Keep your shoulder blade tucked down toward the middle of your back. 4. Hold for __________ seconds. 5. Slowly release the tension, and relax your muscles completely before you repeat the exercise. Repeat __________ times. Complete this exercise __________ times a day. This information is not intended to replace advice given to you by your health care provider. Make sure you discuss any questions you have with your health care provider. Document Released: 08/04/2005 Document Revised: 04/10/2016 Document Reviewed: 08/18/2015 Elsevier Interactive Patient Education  [employer].

## 2018-09-15 ENCOUNTER — Ambulatory Visit (INDEPENDENT_AMBULATORY_CARE_PROVIDER_SITE_OTHER): Payer: BLUE CROSS/BLUE SHIELD | Admitting: Orthopaedic Surgery

## 2018-09-15 ENCOUNTER — Encounter (INDEPENDENT_AMBULATORY_CARE_PROVIDER_SITE_OTHER): Payer: Self-pay | Admitting: Orthopaedic Surgery

## 2018-09-15 ENCOUNTER — Ambulatory Visit (INDEPENDENT_AMBULATORY_CARE_PROVIDER_SITE_OTHER): Payer: Self-pay

## 2018-09-15 VITALS — BP 114/72 | HR 63 | Ht 67.0 in | Wt 163.0 lb

## 2018-09-15 DIAGNOSIS — M542 Cervicalgia: Secondary | ICD-10-CM
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
## 2018-09-21 ENCOUNTER — Ambulatory Visit (INDEPENDENT_AMBULATORY_CARE_PROVIDER_SITE_OTHER): Payer: Self-pay | Admitting: Orthopaedic Surgery

## 2019-05-09 ENCOUNTER — Ambulatory Visit (INDEPENDENT_AMBULATORY_CARE_PROVIDER_SITE_OTHER): Payer: BLUE CROSS/BLUE SHIELD | Admitting: Family Medicine

## 2019-05-09 ENCOUNTER — Encounter: Payer: Self-pay | Admitting: Family Medicine

## 2019-05-09 ENCOUNTER — Other Ambulatory Visit: Payer: Self-pay

## 2019-05-09 VITALS — BP 104/70 | HR 73 | Temp 97.6°F | Resp 18 | Ht 67.0 in | Wt 162.5 lb

## 2019-05-09 DIAGNOSIS — L237 Allergic contact dermatitis due to plants, except food: Secondary | ICD-10-CM

## 2019-05-09 MED ORDER — PREDNISONE 20 MG PO TABS: ORAL_TABLET | ORAL | 0 refills | Status: DC

## 2019-05-09 NOTE — Progress Notes (Signed)
Danielle Kelly , [birth year], 48 y.o., female MRN: [id number] Patient Care Team    Relationship Specialty Notifications Start End  Natalia Leatherwood, DO PCP - General Family Medicine  05/29/15     Chief Complaint  Patient presents with  . Poison Ivy    Started this AM. On face, arms, back, hands, hairline.      Subjective: Pt presents for an OV with complaints of exposure to poison ivy with rash of 1 day duration.  Associated symptoms include itchy red rash located on her nose, her arms her back her hands and in her hairline.  She states she was cutting down a vine over the weekend, that fell on her face and down around her neck.  She is allergic to poison ivy and reports typically always needing a steroid shot when exposed to poison ivy.  Pt has tried OTC products to ease their symptoms.   Depression screen [facility] 2/9 01/14/2018  Decreased Interest 0  Down, Depressed, Hopeless 0  PHQ - 2 Score 0  Altered sleeping 0  Tired, decreased energy 0  Change in appetite 0  Feeling bad or failure about yourself  0  Trouble concentrating 0  Suicidal thoughts 0  PHQ-9 Score 0    Allergies  Allergen Reactions  . Latex Itching   Social History   Social History Narrative   Lives with significant other.   2 sons, One away at college.   Exercises more than 3 times a week.   Feels safe in her relationship.    Smoke alarm in the home.     Past Medical History:  Diagnosis Date  . Anxiety state 05/29/2015  . Depression 04/18/2014   Past Surgical History:  Procedure Laterality Date  . LBTL Bilateral    Family History  Problem Relation Age of Onset  . Kidney disease Father   . Heart disease Father   . Diabetes Father    Allergies as of 05/09/2019      Reactions   Latex Itching      Medication List       Accurate as of May 09, 2019  4:14 PM. If you have any questions, ask your nurse or doctor.        meloxicam 15 MG tablet Commonly known as: MOBIC Take 1 tablet (15  mg total) by mouth daily.       All past medical history, surgical history, allergies, family history, immunizations andmedications were updated in the EMR today and reviewed under the history and medication portions of their EMR.     ROS: Negative, with the exception of above mentioned in HPI   Objective:  BP 104/70 (BP Location: Right Arm, Patient Position: Sitting, Cuff Size: Normal)   Pulse 73   Temp 97.6 F (36.4 C) (Temporal)   Resp 18   Ht 5\' 7"  (1.702 m)   Wt 162 lb 8 oz (73.7 kg)   LMP 05/04/2019 (Exact Date)   SpO2 99%   BMI 25.45 kg/m  Body mass index is 25.45 kg/m. Gen: Afebrile. No acute distress. Nontoxic in appearance, well developed, well nourished.  HENT: AT. [place].  Rash tip of nose.   Eyes:Pupils Equal Round Reactive to light, Extraocular movements intact,  Conjunctiva without redness, discharge or icterus. Skin: Blotchy red rash with raised small blisters over forearm, tip of nose, back and hands. Neuro:  Normal gait. PERLA. EOMi. Alert. Oriented x3   No exam data present No results found. No results found  for this or any previous visit (from the past 24 hour(s)).  Assessment/Plan: Danielle Kelly is a 48 y.o. female present for OV for  Poison ivy dermatitis Discussed prevention with her today.   Provided IM Depo-Medrol 80 mg injection today.  Start prednisone taper tomorrow.  Try Benadryl gel for comfort.  Aveeno baths for comfort. - predniSONE (DELTASONE) 20 MG tablet; 40 mg x3d, 20 mg x3d, 10 mg x2d  Dispense: 10 tablet; Refill: 0   Reviewed expectations re: course of current medical issues.  Discussed self-management of symptoms.  Outlined signs and symptoms indicating need for more acute intervention.  Patient verbalized understanding and all questions were answered.  Patient received an After-Visit Summary.    No orders of the defined types were placed in this encounter.    Note is dictated utilizing voice recognition software.  Although note has been proof read prior to signing, occasional typographical errors still can be missed. If any questions arise, please do not hesitate to call for verification.   electronically signed by:  Howard Pouch, DO  [place]

## 2019-05-09 NOTE — Patient Instructions (Signed)
fels naptha washes after exposure.  Steroid injection today. Start prednisone tomorrow.   Try the benadryl gel for comfort.    Poison Ivy Dermatitis Poison ivy dermatitis is redness and soreness of the skin caused by chemicals in the leaves of the poison ivy plant. You may have very bad itching, swelling, a rash, and blisters. What are the causes?  Touching a poison ivy plant.  Touching something that has the chemical on it. This may include animals or objects that have come in contact with the plant. What increases the risk?  Going outdoors often in wooded or [place] areas.  Going outdoors without wearing protective clothing, such as closed shoes, long pants, and a long-sleeved shirt. What are the signs or symptoms?   Skin redness.  Very bad itching.  A rash that often includes bumps and blisters. ? The rash usually appears 48 hours after exposure, if you have been exposed before. ? If this is the first time you have been exposed, the rash may not appear until a week after exposure.  Swelling. This may occur if the reaction is very bad. Symptoms usually last for 1-2 weeks. The first time you develop this condition, symptoms may last 3-4 weeks. How is this treated? This condition may be treated with:  Hydrocortisone cream or calamine lotion to relieve itching.  Oatmeal baths to soothe the skin.  Medicines, such as over-the-counter antihistamine tablets.  Oral steroid medicine for more severe reactions. Follow these instructions at home: Medicines  Take or apply over-the-counter and prescription medicines only as told by your doctor.  Use hydrocortisone cream or calamine lotion as needed to help with itching. General instructions  Do not scratch or rub your skin.  Put a cold, wet cloth (cold compress) on the affected areas or take baths in cool water. This will help with itching.  Avoid hot baths and showers.  Take oatmeal baths as needed. Use colloidal oatmeal.  You can get this at a pharmacy or grocery store. Follow the instructions on the package.  While you have the rash, wash your clothes right after you wear them.  Keep all follow-up visits as told by your health care provider. This is important. How is this prevented?   Know what poison ivy looks like, so you can avoid it. ? This plant has three leaves with flowering branches on a single stem. ? The leaves are glossy. ? The leaves have uneven edges that come to a point at the front.  If you touch poison ivy, wash your skin with soap and water right away. Be sure to wash under your fingernails.  When hiking or camping, wear long pants, a long-sleeved shirt, tall socks, and hiking boots. You can also use a lotion on your skin that helps to prevent contact with poison ivy.  If you think that your clothes or outdoor gear came in contact with poison ivy, rinse them off with a garden hose before you bring them inside your house.  When doing yard work or gardening, wear gloves, long sleeves, long pants, and boots. Wash your garden tools and gloves if they come in contact with poison ivy.  If you think that your pet has come into contact with poison ivy, wash him or her with pet shampoo and water. Make sure to wear gloves while washing your pet. Contact a doctor if:  You have open sores in the rash area.  You have more redness, swelling, or pain in the rash area.  You have  redness that spreads beyond the rash area.  You have fluid, blood, or pus coming from the rash area.  You have a fever.  You have a rash over a large area of your body.  You have a rash on your eyes, mouth, or genitals.  Your rash does not get better after a few weeks. Get help right away if:  Your face swells or your eyes swell shut.  You have trouble breathing.  You have trouble swallowing. These symptoms may be an emergency. Do not wait to see if the symptoms will go away. Get medical help right away. Call  your local emergency services (911 in the [place] Do not drive yourself to the hospital. Summary  Poison ivy dermatitis is redness and soreness of the skin caused by chemicals in the leaves of the poison ivy plant.  You may have skin redness, very bad itching, swelling, and a rash.  Do not scratch or rub your skin.  Take or apply over-the-counter and prescription medicines only as told by your doctor. This information is not intended to replace advice given to you by your health care provider. Make sure you discuss any questions you have with your health care provider. Document Released: 09/06/2010 Document Revised: 11/26/2018 Document Reviewed: 07/30/2018 Elsevier Patient Education  2020 [employer].

## 2019-05-10 MED ORDER — METHYLPREDNISOLONE ACETATE 40 MG/ML IJ SUSP
40.0000 mg | Freq: Once | INTRAMUSCULAR | Status: AC
  Administered 2019-05-09: 17:00:00 40 mg via INTRAMUSCULAR

## 2019-05-10 MED ORDER — METHYLPREDNISOLONE ACETATE 80 MG/ML IJ SUSP: 80.0000 mg | Freq: Once | INTRAMUSCULAR | Status: DC

## 2019-05-10 NOTE — Addendum Note (Signed)
Addended by: Caroll Rancher L on: 05/10/2019 08:08 AM   Modules accepted: Orders

## 2019-12-19 ENCOUNTER — Ambulatory Visit (INDEPENDENT_AMBULATORY_CARE_PROVIDER_SITE_OTHER): Payer: BLUE CROSS/BLUE SHIELD | Admitting: Family Medicine

## 2019-12-19 ENCOUNTER — Other Ambulatory Visit: Payer: Self-pay

## 2019-12-19 ENCOUNTER — Encounter: Payer: Self-pay | Admitting: Family Medicine

## 2019-12-19 VITALS — BP 124/79 | HR 72 | Temp 97.6°F | Resp 17 | Ht 67.0 in | Wt 170.4 lb

## 2019-12-19 DIAGNOSIS — G5601 Carpal tunnel syndrome, right upper limb: Secondary | ICD-10-CM

## 2019-12-19 DIAGNOSIS — W57XXXA Bitten or stung by nonvenomous insect and other nonvenomous arthropods, initial encounter: Secondary | ICD-10-CM

## 2019-12-19 DIAGNOSIS — M542 Cervicalgia: Secondary | ICD-10-CM

## 2019-12-19 DIAGNOSIS — T148XXA Other injury of unspecified body region, initial encounter: Secondary | ICD-10-CM

## 2019-12-19 HISTORY — DX: Bitten or stung by nonvenomous insect and other nonvenomous arthropods, initial encounter: W57.XXXA

## 2019-12-19 MED ORDER — DOXYCYCLINE HYCLATE 100 MG PO TABS: 100.0000 mg | ORAL_TABLET | Freq: Two times a day (BID) | ORAL | 0 refills | Status: DC

## 2019-12-19 MED ORDER — METHOCARBAMOL 500 MG PO TABS: 500.0000 mg | ORAL_TABLET | Freq: Three times a day (TID) | ORAL | 0 refills | Status: DC | PRN

## 2019-12-19 MED ORDER — METHYLPREDNISOLONE ACETATE 80 MG/ML IJ SUSP
80.0000 mg | Freq: Once | INTRAMUSCULAR | Status: AC
  Administered 2019-12-19: 12:00:00 80 mg via INTRAMUSCULAR

## 2019-12-19 MED ORDER — NAPROXEN 500 MG PO TABS: 500.0000 mg | ORAL_TABLET | Freq: Two times a day (BID) | ORAL | 2 refills | Status: DC

## 2019-12-19 NOTE — Progress Notes (Signed)
This visit occurred during the SARS-CoV-2 public health emergency.  Safety protocols were in place, including screening questions prior to the visit, additional usage of staff PPE, and extensive cleaning of exam room while observing appropriate contact time as indicated for disinfecting solutions.    Danielle Kelly , [birth year], 49 y.o., female MRN: [id number] Patient Care Team    Relationship Specialty Notifications Start End  Natalia Leatherwood, DO PCP - General Family Medicine  05/29/15     Chief Complaint  Patient presents with  . Neck Pain    Pt has had neck pain x7 days. Pt fell a month ago and did have several tick bites within the past month. Does not hurt as bad with movement and painful when sitting      Subjective: Pt presents for an OV with complaints of worsening neck pain of 7 days duration.  Associated symptoms include neck pain with movement.  She was seen January 2020 at [facility] orthopedics for neck discomfort and found to have mild degenerative changes C5-C6.  She does not recall recent injury to cause her neck to start hurting.  She did have a fall about a month ago, but states she did not hurt herself during that fall.  Last year when she saw the orthopedic it hurts to turn her neck.  She states it does not hurt as bad as it did last year but it does hurt with some neck movements.  Patient had taking cyclobenzaprine and it was not effective.  She also has noticed increase in right hand weakness and discomfort.  She denies of right 4 wheelers [place] and notices gripping the handlebars can be uncomfortable.  Tick exposure: Patient reports she has been exposed to several tick bites over the last year and over the last month.  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  She does endorse last year having a maculopapular rash around her beltline that resolved.  Today she states she has had a tick bite on her inner thigh about 4-5 weeks ago and when she pulled off this morning  but seem to be more engorged.  She does feel the one today was a deer tick.  Depression screen [facility] 2/9 01/14/2018  Decreased Interest 0  Down, Depressed, Hopeless 0  PHQ - 2 Score 0  Altered sleeping 0  Tired, decreased energy 0  Change in appetite 0  Feeling bad or failure about yourself  0  Trouble concentrating 0  Suicidal thoughts 0  PHQ-9 Score 0    Allergies  Allergen Reactions  . Latex Itching   Social History   Social History Narrative   Lives with significant other.   2 sons, One away at college.   Exercises more than 3 times a week.   Feels safe in her relationship.    Smoke alarm in the home.     Past Medical History:  Diagnosis Date  . Anxiety state 05/29/2015  . Depression 04/18/2014   Past Surgical History:  Procedure Laterality Date  . LBTL Bilateral    Family History  Problem Relation Age of Onset  . Kidney disease Father   . Heart disease Father   . Diabetes Father    Allergies as of 12/19/2019      Reactions   Latex Itching      Medication List       Accurate as of Dec 19, 2019 12:30 PM. If you have any questions, ask your nurse or doctor.  STOP taking these medications   predniSONE 20 MG tablet Commonly known as: DELTASONE Stopped by: Felix Pacini, DO     TAKE these medications   doxycycline 100 MG tablet Commonly known as: VIBRA-TABS Take 1 tablet (100 mg total) by mouth 2 (two) times daily. Started by: Felix Pacini, DO   methocarbamol 500 MG tablet Commonly known as: Robaxin Take 1 tablet (500 mg total) by mouth every 8 (eight) hours as needed for muscle spasms. Started by: Felix Pacini, DO   naproxen 500 MG tablet Commonly known as: Naprosyn Take 1 tablet (500 mg total) by mouth 2 (two) times daily with a meal. Started by: Felix Pacini, DO       All past medical history, surgical history, allergies, family history, immunizations andmedications were updated in the EMR today and reviewed under the history and  medication portions of their EMR.     ROS: Negative, with the exception of above mentioned in HPI   Objective:  BP 124/79 (BP Location: Left Arm, Patient Position: Sitting, Cuff Size: Normal)   Pulse 72   Temp 97.6 F (36.4 C) (Temporal)   Resp 17   Ht 5\' 7"  (1.702 m)   Wt 170 lb 6 oz (77.3 kg)   LMP 12/18/2019 (Exact Date)   SpO2 100%   BMI 26.68 kg/m  Body mass index is 26.68 kg/m. Gen: Afebrile. No acute distress. Nontoxic in appearance, well developed, well nourished.  Pleasant female. HENT: AT. [place]. Eyes:Pupils Equal Round Reactive to light, Extraocular movements intact,  Conjunctiva without redness, discharge or icterus. MSK: Cervical spine with full range of motion, discomfort with flexion extension and sidebending (mild).  No bony tenderness.  Right trapezius muscle spasm and tenderness to palpation.  Positive Tinel's at right wrist. Skin: No rashes, purpura or petechiae.  Neuro:  Normal gait. PERLA. EOMi. Alert. Oriented x3.  Negative Kernig's Psych: Normal affect, dress and demeanor. Normal speech. Normal thought content and judgment.  No exam data present No results found. No results found for this or any previous visit (from the past 24 hour(s)).  Assessment/Plan: Danielle Kelly is a 49 y.o. female present for OV for  Tick bite, initial encounter Multiple tick bite exposures, with reports of erythema migrans-like rash approximately 1 year ago.  Untreated. - B. burgdorfi antibodies collected today -Start doxy twice daily x10 days.  If antibodies are positive consider lengthening prescription to 21 days.  Cervical pain Heat.  Massage.  Recommended. Naproxen twice daily 5-7 days with food. IM Depo-Medrol injection provided today Robaxin 3 times daily (was taking cyclobenzaprine and did not feel helpful) - methylPREDNISolone acetate (DEPO-MEDROL) injection 80 mg  Carpal tunnel syndrome of right wrist She does have positive Tinel's on the right suggesting at  least mild carpal tunnel causing some of her hand/wrist discomfort.  There may be a component of cervical radiculopathy of the C5-C6 as well. Stretches for carpal tunnel was discussed with her today. Carpal tunnel night guards was discussed with her today.   Reviewed expectations re: course of current medical issues.  Discussed self-management of symptoms.  Outlined signs and symptoms indicating need for more acute intervention.  Patient verbalized understanding and all questions were answered.  Patient received an After-Visit Summary.  Follow-up in 2 weeks as needed if symptoms not improved, sooner if worsening  Orders Placed This Encounter  Procedures  . B. burgdorfi antibodies   Meds ordered this encounter  Medications  . methocarbamol (ROBAXIN) 500 MG tablet    Sig: Take  1 tablet (500 mg total) by mouth every 8 (eight) hours as needed for muscle spasms.    Dispense:  90 tablet    Refill:  0  . naproxen (NAPROSYN) 500 MG tablet    Sig: Take 1 tablet (500 mg total) by mouth 2 (two) times daily with a meal.    Dispense:  30 tablet    Refill:  2  . doxycycline (VIBRA-TABS) 100 MG tablet    Sig: Take 1 tablet (100 mg total) by mouth 2 (two) times daily.    Dispense:  20 tablet    Refill:  0  . methylPREDNISolone acetate (DEPO-MEDROL) injection 80 mg   Referral Orders  No referral(s) requested today     Note is dictated utilizing voice recognition software. Although note has been proof read prior to signing, occasional typographical errors still can be missed. If any questions arise, please do not hesitate to call for verification.   electronically signed by:  Howard Pouch, DO  [place]

## 2019-12-19 NOTE — Patient Instructions (Addendum)
Heat application a few times a day for at least a week.  Muscle relaxer can be taken three times a day- watch for sedation.  Massage can be helpful Naproxen every 12 hours with food- at least 5-7 days scheduled.   Tick Bite Information, Adult  Ticks are insects that can bite. Most ticks live in shrubs and grassy areas. They climb onto people and animals that go by. Then they bite. Some ticks carry germs that can make you sick. How can I prevent tick bites?  Use an insect repellent that has 20% or higher of the ingredients DEET, picaridin, or IR3535. Put this insect repellent on: ? Bare skin. ? The tops of your boots. ? Your pant legs. ? The ends of your sleeves.  If you use an insect repellent that has the ingredient permethrin, make sure to follow the instructions on the bottle. Treat the following: ? Clothing. ? Supplies. ? Boots. ? Tents.  Wear long sleeves, long pants, and light colors.  Tuck your pant legs into your socks.  Stay in the middle of the trail.  Try not to walk through long grass.  Before going inside your house, check your clothes, hair, and skin for ticks. Make sure to check your head, neck, armpits, waist, groin, and joint areas.  Check for ticks every day.  When you come indoors: ? Wash your clothes right away. ? Shower right away. ? Dry your clothes in a dryer on high heat for 60 minutes or more. What is the right way to remove a tick? Remove a tick from your skin as soon as possible.  To remove a tick that is crawling on your skin: ? Go outdoors and brush the tick off. ? Use tape or a lint roller.  To remove a tick that is biting: ? Wash your hands. ? If you have latex gloves, put them on. ? Use tweezers, curved forceps, or a tick-removal tool to grasp the tick. Grasp the tick as close to your skin and as close to the tick's head as possible. ? Gently pull up until the tick lets go.  Try to keep the tick's head attached to its body.  Do not  twist or jerk the tick.  Do not squeeze or crush the tick. Do not try to remove a tick with heat, alcohol, petroleum jelly, or fingernail polish. How should I get rid of a tick? Here are some ways to get rid of a tick that is alive:  Place the tick in rubbing alcohol.  Place the tick in a bag or container you can close tightly.  Wrap the tick tightly in tape.  Flush the tick down the toilet. Contact a doctor if:  You have symptoms of a disease, such as: ? Pain in a muscle, joint, or bone. ? Trouble walking or moving your legs. ? Numbness in your legs. ? Inability to move (paralysis). ? A red rash that makes a circle (bull's-eye rash). ? Redness and swelling where the tick bit you. ? A fever. ? Throwing up (vomiting) over and over. ? Diarrhea. ? Weight loss. ? Tender and swollen lymph glands. ? Shortness of breath. ? Cough. ? Belly pain (abdominal pain). ? Headache. ? Being more tired than normal. ? A change in how alert (conscious) you are. ? Confusion. Get help right away if:  You cannot remove a tick.  A part of a tick breaks off and gets stuck in your skin.  You are feeling worse. Summary  Ticks may carry germs that can make you sick.  To prevent tick bites, wear long sleeves, long pants, and light colors. Use insect repellent. Follow the instructions on the bottle.  If the tick is biting, do not try to remove it with heat, alcohol, petroleum jelly, or fingernail polish.  Use tweezers, curved forceps, or a tick-removal tool to grasp the tick. Gently pull up until the tick lets go. Do not twist or jerk the tick. Do not squeeze or crush the tick.  If you have symptoms, contact a doctor. This information is not intended to replace advice given to you by your health care provider. Make sure you discuss any questions you have with your health care provider. Document Revised: 07/19/2018 Document Reviewed: 11/14/2016 Elsevier Patient Education  2020 [employer]        Cervical Radiculopathy  Cervical radiculopathy means that a nerve in the neck (a cervical nerve) is pinched or bruised. This can happen because of an injury to the cervical spine (vertebrae) in the neck, or as a normal part of getting older. This can cause pain or loss of feeling (numbness) that runs from your neck all the way down to your arm and fingers. Often, this condition gets better with rest. Treatment may be needed if the condition does not get better. What are the causes?  A neck injury.  A bulging disk in your spine.  Muscle movements that you cannot control (muscle spasms).  Tight muscles in your neck due to overuse.  Arthritis.  Breakdown in the bones and joints of the spine (spondylosis) due to getting older.  Bone spurs that form near the nerves in the neck. What are the signs or symptoms?  Pain. The pain may: ? Run from the neck to the arm and hand. ? Be very bad or irritating. ? Be worse when you move your neck.  Loss of feeling or tingling in your arm or hand.  Weakness in your arm or hand, in very bad cases. How is this treated? In many cases, treatment is not needed for this condition. With rest, the condition often gets better over time. If treatment is needed, options may include:  Wearing a soft neck collar (cervical collar) for short periods of time, as told by your doctor.  Doing exercises (physical therapy) to strengthen your neck muscles.  Taking medicines.  Having shots (injections) in your spine, in very bad cases.  Having surgery. This may be needed if other treatments do not help. The type of surgery that is used depends on the cause of your condition. Follow these instructions at home: If you have a soft neck collar:  Wear it as told by your doctor. Remove it only as told by your doctor.  Ask your doctor if you can remove the collar for cleaning and bathing. If you are allowed to remove the collar for cleaning or bathing: ?  Follow instructions from your doctor about how to remove the collar safely. ? Clean the collar by wiping it with mild soap and water and drying it completely. ? Take out any removable pads in the collar every 1-2 days. Wash them by hand with soap and water. Let them air-dry completely before you put them back in the collar. ? Check your skin under the collar for redness or sores. If you see any, tell your doctor. Managing pain      Take over-the-counter and prescription medicines only as told by your doctor.  If told, put ice  on the painful area. ? If you have a soft neck collar, remove it as told by your doctor. ? Put ice in a plastic bag. ? Place a towel between your skin and the bag. ? Leave the ice on for 20 minutes, 2-3 times a day.  If using ice does not help, you can try using heat. Use the heat source that your doctor recommends, such as a moist heat pack or a heating pad. ? Place a towel between your skin and the heat source. ? Leave the heat on for 20-30 minutes. ? Remove the heat if your skin turns bright red. This is very important if you are unable to feel pain, heat, or cold. You may have a greater risk of getting burned.  You may try a gentle neck and shoulder rub (massage). Activity  Rest as needed.  Return to your normal activities as told by your doctor. Ask your doctor what activities are safe for you.  Do exercises as told by your doctor or physical therapist.  Do not lift anything that is heavier than 10 lb (4.5 kg) until your doctor tells you that it is safe. General instructions  Use a flat pillow when you sleep.  Do not drive while wearing a soft neck collar. If you do not have a soft neck collar, ask your doctor if it is safe to drive while your neck heals.  Ask your doctor if the medicine prescribed to you requires you to avoid driving or using heavy machinery.  Do not use any products that contain nicotine or tobacco, such as cigarettes,  e-cigarettes, and chewing tobacco. These can delay healing. If you need help quitting, ask your doctor.  Keep all follow-up visits as told by your doctor. This is important. Contact a doctor if:  Your condition does not get better with treatment. Get help right away if:  Your pain gets worse and is not helped with medicine.  You lose feeling or feel weak in your hand, arm, face, or leg.  You have a high fever.  You have a stiff neck.  You cannot control when you poop or pee (have incontinence).  You have trouble with walking, balance, or talking. Summary  Cervical radiculopathy means that a nerve in the neck is pinched or bruised.  A nerve can get pinched from a bulging disk, arthritis, an injury to the neck, or other causes.  Symptoms include pain, tingling, or loss of feeling that goes from the neck into the arm or hand.  Weakness in your arm or hand can happen in very bad cases.  Treatment may include resting, wearing a soft neck collar, and doing exercises. You might need to take medicines for pain. In very bad cases, shots or surgery may be needed. This information is not intended to replace advice given to you by your health care provider. Make sure you discuss any questions you have with your health care provider. Document Revised: 06/25/2018 Document Reviewed: 06/25/2018 Elsevier Patient Education  2020 [employer].

## 2019-12-20 LAB — B. BURGDORFI ANTIBODIES: B burgdorferi Ab IgG+IgM: <0.9 index

## 2020-02-09 ENCOUNTER — Telehealth: Payer: Self-pay | Admitting: Orthopaedic Surgery

## 2020-02-09 NOTE — Telephone Encounter (Signed)
Work her in to see me thanks

## 2020-02-09 NOTE — Telephone Encounter (Signed)
Patient called to make an appointment with Dr. Ophelia Charter for her neck pain, Dr Ophelia Charter and [facility] Fearing do not have anything available until June 30th.  Her insurance will expire at the end of the month and wanted to know if Dr. Ophelia Charter could recommend another facility she could go to be looked at for her neck that she could possible get into before the end of the month.  CB#[phone].  Thank you.

## 2020-02-09 NOTE — Telephone Encounter (Signed)
Please see below and advise.  Would you like for someone else in the practice  to see her?

## 2020-02-10 NOTE — Telephone Encounter (Signed)
I left voicemail for patient advising. Please work in to Dr. Ophelia Charter schedule on Tuesday if she calls back to schedule appt.

## 2020-02-14 ENCOUNTER — Encounter: Payer: Self-pay | Admitting: Orthopaedic Surgery

## 2020-02-14 ENCOUNTER — Ambulatory Visit (INDEPENDENT_AMBULATORY_CARE_PROVIDER_SITE_OTHER): Payer: BC Managed Care – PPO | Admitting: Orthopaedic Surgery

## 2020-02-14 ENCOUNTER — Ambulatory Visit: Payer: Self-pay

## 2020-02-14 VITALS — Ht 67.0 in | Wt 165.0 lb

## 2020-02-14 DIAGNOSIS — M542 Cervicalgia: Secondary | ICD-10-CM

## 2020-02-15 ENCOUNTER — Other Ambulatory Visit (HOSPITAL_BASED_OUTPATIENT_CLINIC_OR_DEPARTMENT_OTHER): Payer: Self-pay | Admitting: Emergency Medicine
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
## 2020-02-25 ENCOUNTER — Ambulatory Visit (HOSPITAL_BASED_OUTPATIENT_CLINIC_OR_DEPARTMENT_OTHER)
Admission: RE | Admit: 2020-02-25 | Discharge: 2020-02-25 | Disposition: A | Discharge status: Home / Self Care | Payer: BC Managed Care – PPO | Source: Ambulatory Visit | Attending: Orthopaedic Surgery | Admitting: Orthopaedic Surgery

## 2020-02-25 ENCOUNTER — Other Ambulatory Visit: Payer: Self-pay

## 2020-02-25 DIAGNOSIS — M542 Cervicalgia: Secondary | ICD-10-CM | POA: Insufficient documentation

## 2020-03-07 ENCOUNTER — Encounter: Payer: Self-pay | Admitting: Orthopaedic Surgery

## 2020-03-07 ENCOUNTER — Ambulatory Visit (INDEPENDENT_AMBULATORY_CARE_PROVIDER_SITE_OTHER): Payer: Self-pay | Admitting: Orthopaedic Surgery

## 2020-03-07 DIAGNOSIS — M5020 Other cervical disc displacement, unspecified cervical region: Secondary | ICD-10-CM
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
## 2020-03-08 DIAGNOSIS — M5020 Other cervical disc displacement, unspecified cervical region: Secondary | ICD-10-CM | POA: Insufficient documentation

## 2020-08-16 ENCOUNTER — Other Ambulatory Visit: Payer: Self-pay

## 2020-08-16 ENCOUNTER — Ambulatory Visit (INDEPENDENT_AMBULATORY_CARE_PROVIDER_SITE_OTHER): Payer: 59 | Admitting: Podiatry

## 2020-08-16 ENCOUNTER — Ambulatory Visit (INDEPENDENT_AMBULATORY_CARE_PROVIDER_SITE_OTHER): Payer: 59

## 2020-08-16 DIAGNOSIS — M722 Plantar fascial fibromatosis: Secondary | ICD-10-CM | POA: Diagnosis not present

## 2020-08-16 DIAGNOSIS — M79672 Pain in left foot: Secondary | ICD-10-CM

## 2020-08-16 MED ORDER — DICLOFENAC SODIUM 75 MG PO TBEC: 75.0000 mg | DELAYED_RELEASE_TABLET | Freq: Two times a day (BID) | ORAL | 2 refills | Status: DC

## 2020-08-16 MED ORDER — TRIAMCINOLONE ACETONIDE 10 MG/ML IJ SUSP
10.0000 mg | Freq: Once | INTRAMUSCULAR | Status: AC
  Administered 2020-08-16: 11:00:00 10 mg

## 2020-08-16 NOTE — Progress Notes (Signed)
Subjective:   Patient ID: Danielle Kelly, female   DOB: 49 y.o.   MRN: [id number]   HPI Patient presents with severe pain in the left plantar heel stating its been present now for around a year and worse over the last 3 months and is bad when she gets up in the morning and after periods of sitting.  Patient does not smoke likes to be active   Review of Systems  All other systems reviewed and are negative.       Objective:  Physical Exam Vitals and nursing note reviewed.  Constitutional:      Appearance: She is well-developed and well-nourished.  Cardiovascular:     Pulses: Intact distal pulses.  Pulmonary:     Effort: Pulmonary effort is normal.  Musculoskeletal:        General: Normal range of motion.  Skin:    General: Skin is warm.  Neurological:     Mental Status: She is alert.     Neurovascular status intact muscle strength adequate range of motion adequate with patient found to have exquisite discomfort plantar aspect left heel at the insertional point tendon into the calcaneus with what appears to be a heavy walker of the heel bone itself.  Moderate cavus foot structure with patient found to have good digital perfusion well oriented x3     Assessment:  Acute plantar fasciitis left with inflammation fluid with foot structural issues     Plan:  H&P reviewed condition and educating her on condition.  Today I did sterile prep injected the plantar fascial left 3 mg Kenalog 5 mg Xylocaine and applied fascial brace to lift up the arch along with diclofenac 75 mg twice daily.  Reappoint 2 weeks and discussed shoe gear modifications up until that time  X-rays indicate cavus foot structure small spur no indication stress fracture arthritis

## 2020-08-16 NOTE — Patient Instructions (Signed)

## 2020-08-27 LAB — HM PAP SMEAR

## 2020-08-30 ENCOUNTER — Ambulatory Visit: Payer: 59 | Admitting: Podiatry

## 2020-09-12 IMAGING — MR MR CERVICAL SPINE W/O CM
5 series · 32 of 48 positions shown · non-contrast
Comparison: Radiography 02/14/2020

CLINICAL DATA: Neck pain over the last year. Bilateral hand
weakness. Headaches. C5-6 left disc protrusion.

EXAM:
MRI CERVICAL SPINE WITHOUT CONTRAST
TECHNIQUE: Multiplanar, multisequence MR imaging of the cervical spine was
performed. No intravenous contrast was administered.

[Series 2: T2 · sagittal · 3.0mm · 0.69mm/px · 5 of 14 slices shown (1 of 3)]
[im 1/14]
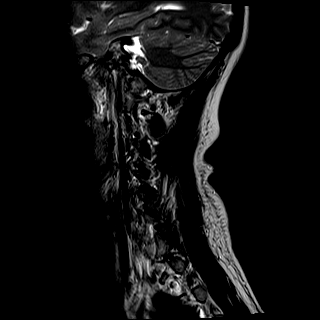
[im 4/14]
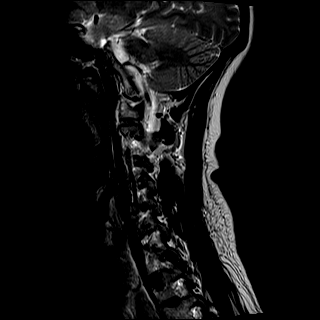
[im 7/14]
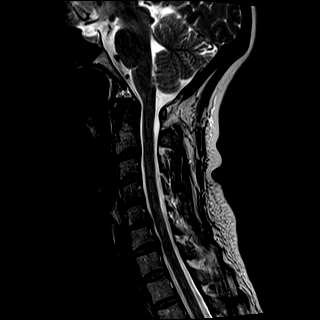
[im 10/14]
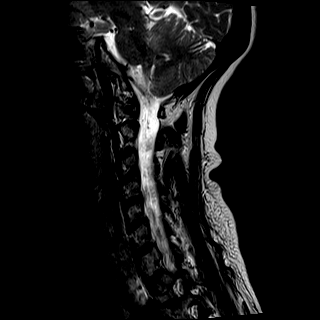
[im 14/14]
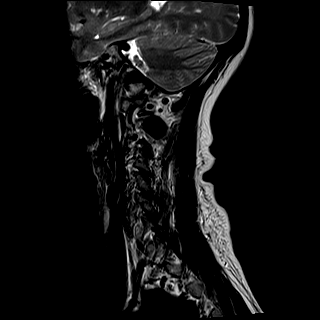

[Series 3: T1 · sagittal · 3.0mm · 0.69mm/px · 5 of 14 slices shown]
[im 1/14]
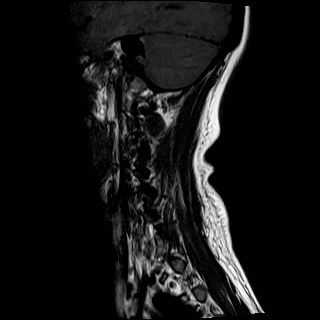
[im 4/14]
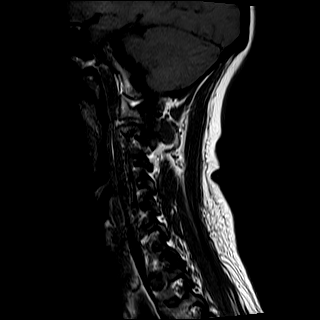
[im 7/14]
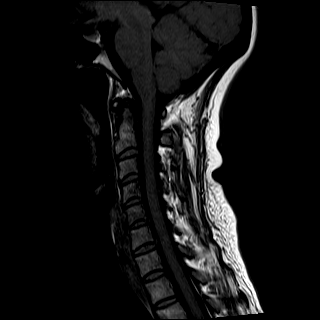
[im 10/14]
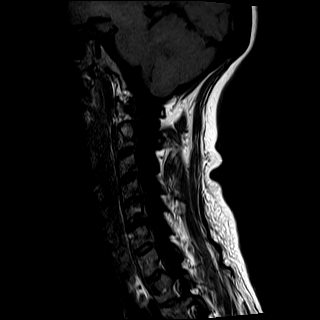
[im 14/14]
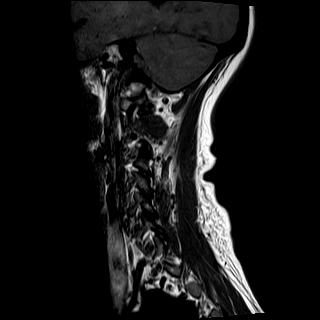

[Series 4: STIR · sagittal · 3.0mm · 0.69mm/px · 2 of 14 slices shown]
[im 1/14]
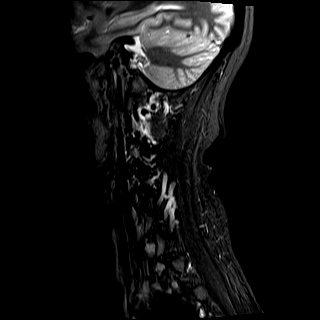
[im 3/14]
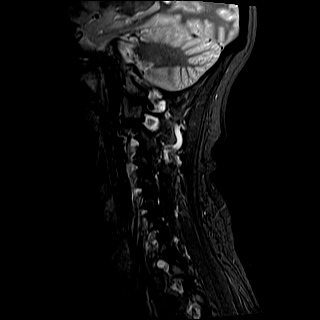

[Series 6: T2 · axial · 3.0mm · 0.39mm/px · z∈[-59,+69]mm · 10 of 40 slices shown (2 of 3)]
[im 3/40]
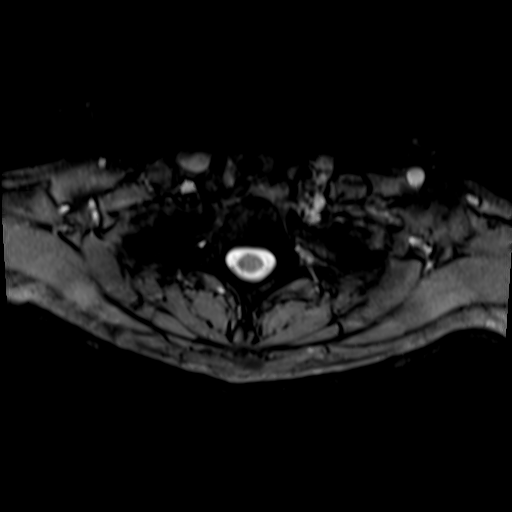
[im 6/40]
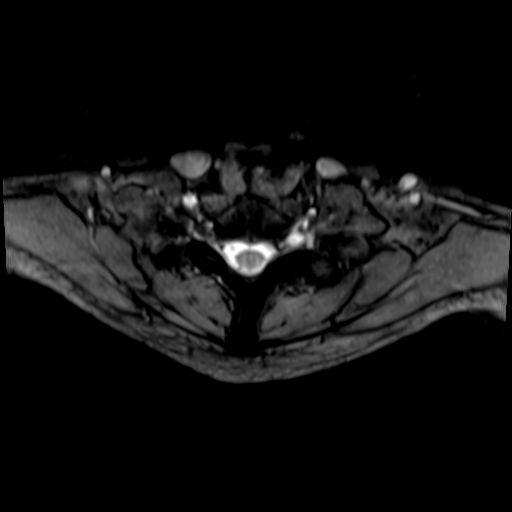
[im 8/40]
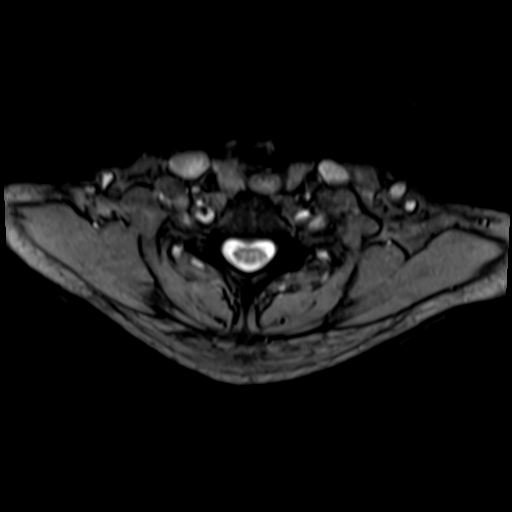
[im 14/40]
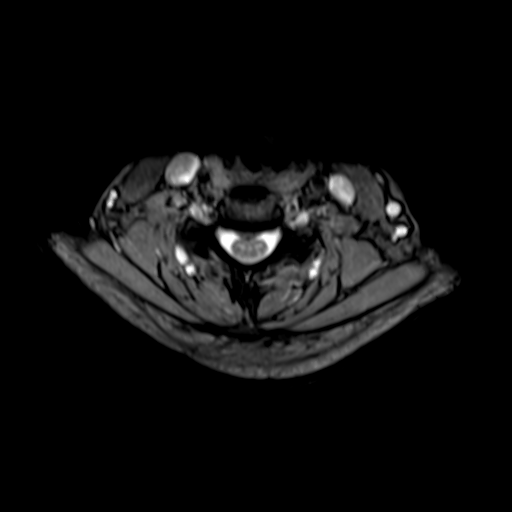
[im 19/40]
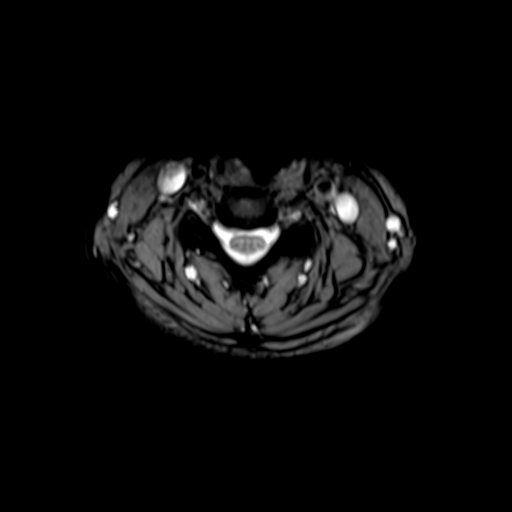
[im 21/40]
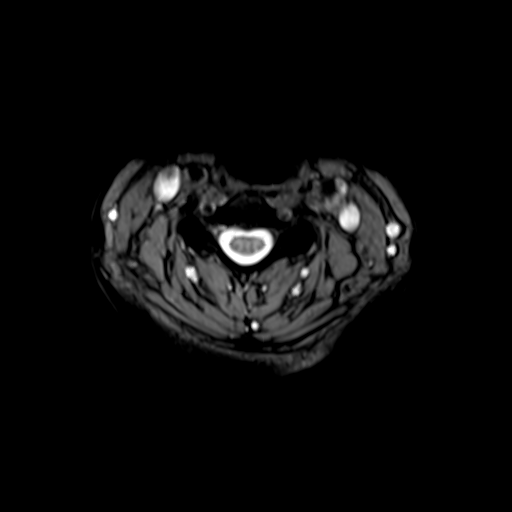
[im 24/40]
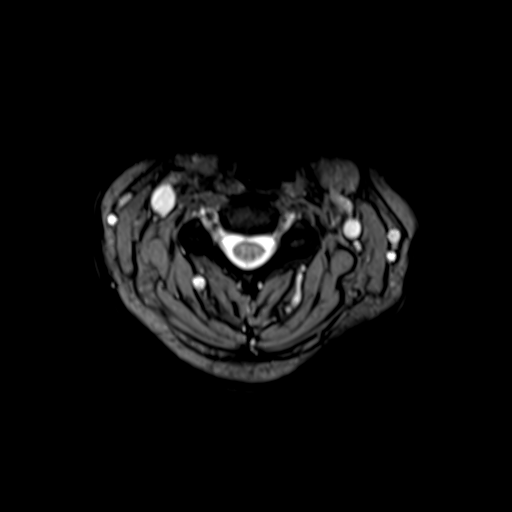
[im 29/40]
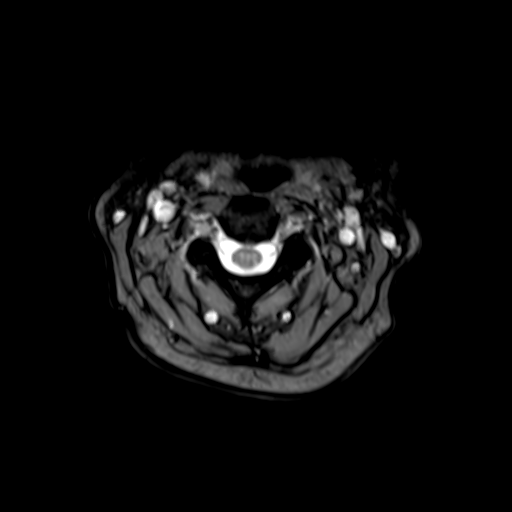
[im 34/40]
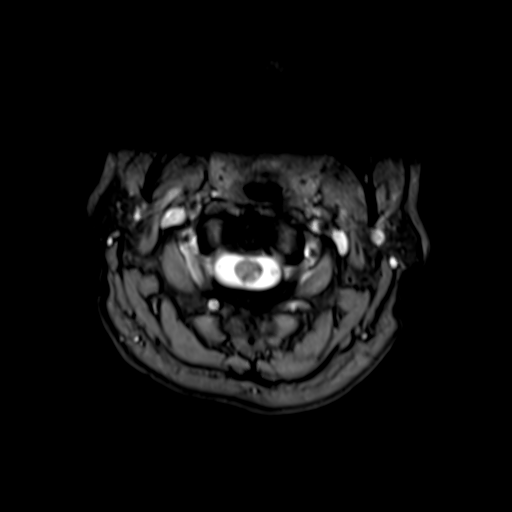
[im 40/40]
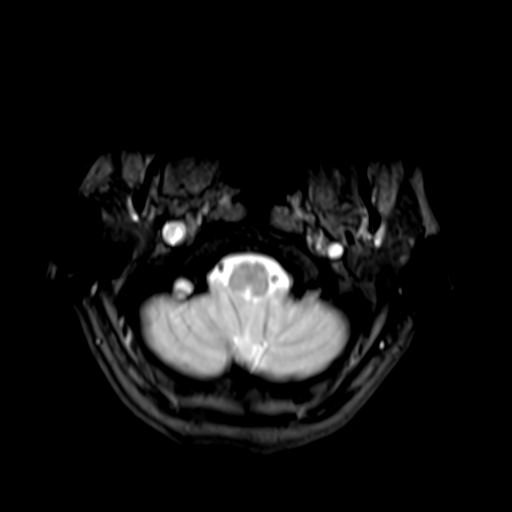

[Series 7: T2 · axial · 3.0mm · 0.62mm/px · z∈[-59,+69]mm · 10 of 40 slices shown (3 of 3)]
[im 3/40]
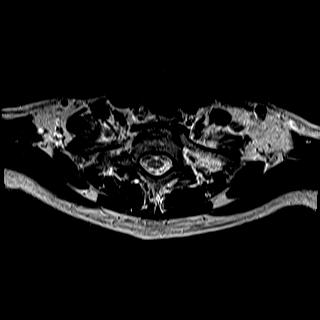
[im 6/40]
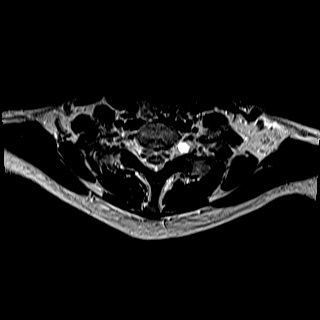
[im 8/40]
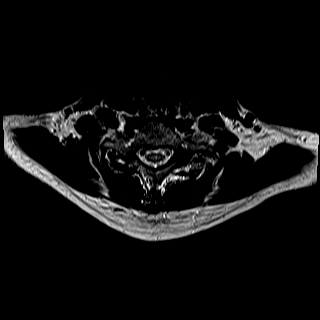
[im 14/40]
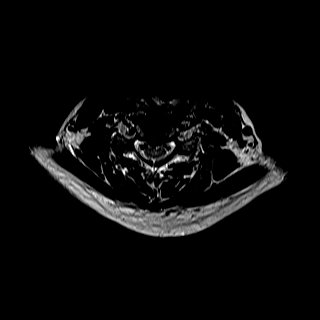
[im 19/40]
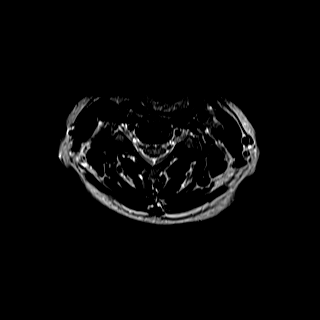
[im 21/40]
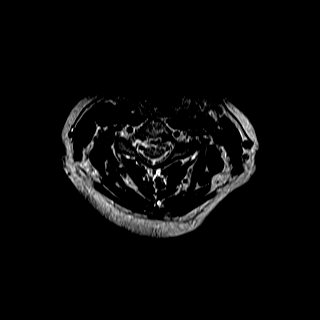
[im 24/40]
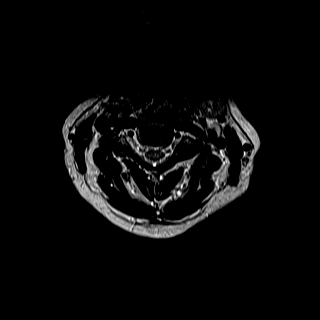
[im 29/40]
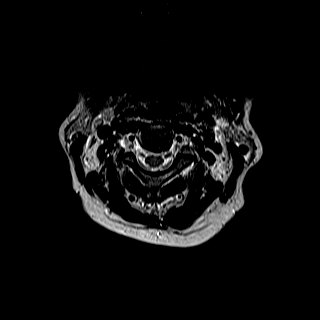
[im 34/40]
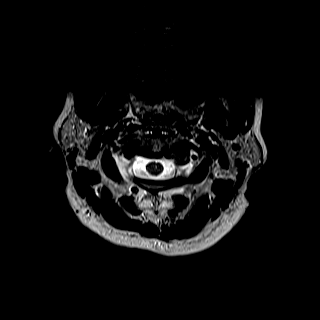
[im 40/40]
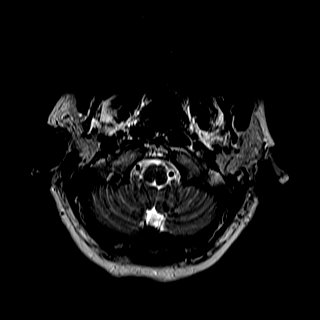

[32 of 48 positions shown; findings below may reference images not displayed]

FINDINGS: Alignment: Normal

Vertebrae: Normal

Cord: Normal.  No cord compression or primary cord lesion.

Posterior Fossa, vertebral arteries, paraspinal tissues: Normal

Disc levels:

No abnormality of the foramen magnum, C1-2, C2-3 or C3-4.

C4-5: Mild bulging of the disc but without compressive narrowing of
the canal or foramina.

C5-6: Central disc protrusion effaces the ventral subarachnoid space
but does not compress the cord or show foraminal extension.

C6-7: Central disc protrusion effaces the ventral subarachnoid space
but does not compress the cord or show foraminal extension.

C7-T1: Normal interspace.
IMPRESSION: Central disc protrusions at C5-6 and C6-7, effacing the ventral
subarachnoid space but not compressing the cord. Ample subarachnoid
space is present dorsal to the cord. No foraminal extension at
either level. No facet arthropathy. No foraminal stenosis.

## 2020-09-13 ENCOUNTER — Other Ambulatory Visit: Payer: Self-pay

## 2020-09-13 ENCOUNTER — Encounter: Payer: Self-pay | Admitting: Podiatry

## 2020-09-13 ENCOUNTER — Ambulatory Visit (INDEPENDENT_AMBULATORY_CARE_PROVIDER_SITE_OTHER): Payer: 59 | Admitting: Podiatry

## 2020-09-13 DIAGNOSIS — M722 Plantar fascial fibromatosis: Secondary | ICD-10-CM

## 2020-09-13 MED ORDER — TRIAMCINOLONE ACETONIDE 10 MG/ML IJ SUSP
10.0000 mg | Freq: Once | INTRAMUSCULAR | Status: AC
  Administered 2020-09-13: 10 mg

## 2020-09-14 NOTE — Progress Notes (Signed)
Subjective:   Patient ID: [name], female   DOB: 50 y.o.   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  States the heel has remained quite intense and she only had a couple days of relief   ROS      Objective:  Physical Exam  Neurovascular status intact with patient found to have exquisite discomfort in the right plantar fascia at the insertion of the tendon into the calcaneus with inflammation fluid of the medial band at the insertion     Assessment:  Acute plantar fasciitis right with inflammation fluid still present despite our previous conservative care     Plan:  H&P reviewed the level of time that she spends on her foot and at this point I did do sterile prep and reinjected the fascial band at insertion 3 mg Kenalog 5 mg Xylocaine and I applied an air fracture walker to completely immobilize the right lower leg.  Discussed that ultimate surgery may be necessary for this condition if symptoms do not settle down and she cannot currently take oral anti-inflammatories but in the future we may do this but she is due to have a lymph node biopsy and then at that point we may be able to resume.  Reappoint 4 weeks or earlier if needed

## 2020-10-11 ENCOUNTER — Ambulatory Visit: Payer: 59 | Admitting: Podiatry

## 2020-10-18 ENCOUNTER — Other Ambulatory Visit: Payer: Self-pay

## 2020-10-18 ENCOUNTER — Encounter: Payer: Self-pay | Admitting: Podiatry

## 2020-10-18 ENCOUNTER — Ambulatory Visit (INDEPENDENT_AMBULATORY_CARE_PROVIDER_SITE_OTHER): Payer: 59 | Admitting: Podiatry

## 2020-10-18 DIAGNOSIS — M722 Plantar fascial fibromatosis: Secondary | ICD-10-CM | POA: Diagnosis not present

## 2020-10-24 NOTE — Progress Notes (Signed)
Subjective:   Patient ID: Danielle Kelly, female   DOB: 50 y.o.   MRN: [id number]   HPI Patient presents stating that she is still having discomfort but it is improved some of the boot seems to be helping her   ROS      Objective:  Physical Exam  Number status intact with patient found to have discomfort in the plantar left heel mostly in the lateral central band mild in the medial with quite a bit of pain upon palpation to the area still noted with moderate diminishment of the fat pad     Assessment:  Acute plantar fasciitis left moderate improvement with continued conservative care H     Plan:  P reviewed condition and at this point I have recommended orthotics long-term to try to control motion and to try to reduce stress on the heel.  I discussed the continuation of anti-inflammatories the continuation of physical therapy topical medicines and shoe gear modifications and reviewed again the possibility for surgical intervention or shockwave therapy if symptoms persist.  Patient is casted today

## 2020-10-25 DIAGNOSIS — R918 Other nonspecific abnormal finding of lung field: Secondary | ICD-10-CM | POA: Insufficient documentation

## 2020-12-11 ENCOUNTER — Other Ambulatory Visit: Payer: Self-pay

## 2020-12-11 ENCOUNTER — Ambulatory Visit (INDEPENDENT_AMBULATORY_CARE_PROVIDER_SITE_OTHER): Payer: 59 | Admitting: Podiatry

## 2020-12-11 DIAGNOSIS — M722 Plantar fascial fibromatosis: Secondary | ICD-10-CM

## 2020-12-11 NOTE — Progress Notes (Signed)
Patient presents today for orthotic pick up. Patient voices no new complaints.  The patient stated that she was to get dress orthotics due to wearing more dress shoes than tennis shoes.  I stated that we would send the orthotics back and get the dress orthotics  Patient will be contacted when the orthotics are ready for pick up.

## 2020-12-11 NOTE — Patient Instructions (Signed)

## 2021-01-29 ENCOUNTER — Telehealth: Payer: Self-pay | Admitting: Podiatry

## 2021-01-29 NOTE — Telephone Encounter (Signed)
Pt left a message yesterday and today about her orthotics that were returned to be corrected.  I called pt back and apologized but I think they may have been lost in the mail and I have searched the office multiple times. I have emailed the company to have them make a new pair and it should be here by end of this week beginning of next week and I would call when they are in.

## 2021-02-05 ENCOUNTER — Telehealth: Payer: Self-pay | Admitting: Podiatry

## 2021-02-05 NOTE — Telephone Encounter (Signed)
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

## 2021-02-05 NOTE — Telephone Encounter (Signed)
Orthotics in.. pt aware ok to pick up.. 

## 2022-03-13 LAB — HM MAMMOGRAPHY

## 2022-05-15 LAB — HM COLONOSCOPY

## 2022-07-14 ENCOUNTER — Telehealth: Payer: Self-pay

## 2022-07-14 NOTE — Telephone Encounter (Signed)
Transition Care Management Follow-up Telephone Call Date of discharge and from where: [facility] 07/11/22 How have you been since you were released from the hospital? no Any questions or concerns? No  Items Reviewed: Did the pt receive and understand the discharge instructions provided? Yes  Medications obtained and verified? Yes  Other? Yes  Any new allergies since your discharge? No  Dietary orders reviewed? No Do you have support at home? Yes   Home Care and Equipment/Supplies: Were home health services ordered? not applicable If so, what is the name of the agency? N/A  Has the agency set up a time to come to the patient's home? not applicable Were any new equipment or medical supplies ordered?  No What is the name of the medical supply agency? N/A Were you able to get the supplies/equipment? not applicable Do you have any questions related to the use of the equipment or supplies? No  Functional Questionnaire: (I = Independent and D = Dependent) ADLs: I  Bathing/Dressing- I  Meal Prep- I  Eating- I  Maintaining continence- I  Transferring/Ambulation- I  Managing Meds- I  Follow up appointments reviewed:  PCP Hospital f/u appt confirmed? Yes  Scheduled to see KUNEFF on 07/16/22 @ 1120. Specialist Hospital f/u appt confirmed? No   Are transportation arrangements needed? No  If their condition worsens, is the pt aware to call PCP or go to the Emergency Dept.? Yes Was the patient provided with contact information for the PCP's office or ED? Yes Was to pt encouraged to call back with questions or concerns? Yes

## 2022-07-16 ENCOUNTER — Encounter: Payer: Self-pay | Admitting: Family Medicine

## 2022-07-16 ENCOUNTER — Ambulatory Visit (INDEPENDENT_AMBULATORY_CARE_PROVIDER_SITE_OTHER): Payer: Self-pay | Admitting: Family Medicine

## 2022-07-16 VITALS — BP 125/78 | HR 82 | Temp 98.2°F | Wt 173.2 lb

## 2022-07-16 DIAGNOSIS — F418 Other specified anxiety disorders: Secondary | ICD-10-CM

## 2022-07-16 DIAGNOSIS — H8309 Labyrinthitis, unspecified ear: Secondary | ICD-10-CM

## 2022-07-16 DIAGNOSIS — H8111 Benign paroxysmal vertigo, right ear: Secondary | ICD-10-CM

## 2022-07-16 MED ORDER — METHYLPREDNISOLONE ACETATE 80 MG/ML IJ SUSP
80.0000 mg | Freq: Once | INTRAMUSCULAR | Status: AC
  Administered 2022-07-16: 80 mg via INTRAMUSCULAR

## 2022-07-16 MED ORDER — ONDANSETRON 4 MG PO TBDP: 4.0000 mg | ORAL_TABLET | Freq: Two times a day (BID) | ORAL | 2 refills | Status: DC | PRN

## 2022-07-16 MED ORDER — FLUTICASONE PROPIONATE 50 MCG/ACT NA SUSP: 1.0000 | Freq: Two times a day (BID) | NASAL | 6 refills | Status: DC

## 2022-07-16 MED ORDER — ESCITALOPRAM OXALATE 10 MG PO TABS: 10.0000 mg | ORAL_TABLET | Freq: Every day | ORAL | 1 refills | Status: DC

## 2022-07-16 NOTE — Progress Notes (Signed)
Danielle Kelly , 03-25-1971, 51 y.o., female MRN: [id number] Patient Care Team    Relationship Specialty Notifications Start End  Natalia Leatherwood, DO PCP - General Family Medicine  05/29/15     Chief Complaint  Patient presents with   Dizziness     Subjective: Pt presents for an OV with complaints of vertigo.  She reports it started suddenly and whenever she moved her head (to the right) the room would start spinning.  She reports associated nausea.  She was seen in the emergency room on 07/11/2022 diagnosed with BPPV, provided prescriptions for Ativan, Zofran and meclizine of which she is taking all of them scheduled.  She reports she feels off and woozy. She feels guarded on turning her head due to reproduction of symptoms.  Also endorses feeling mildly depressed.  She reports she is following at home but she has lack of motivation to go outside of her property, be social etc.     01/14/2018    9:54 AM  Depression screen PHQ 2/9  Decreased Interest 0  Down, Depressed, Hopeless 0  PHQ - 2 Score 0  Altered sleeping 0  Tired, decreased energy 0  Change in appetite 0  Feeling bad or failure about yourself  0  Trouble concentrating 0  Suicidal thoughts 0  PHQ-9 Score 0    Allergies  Allergen Reactions   Latex Itching   Social History   Social History Narrative   Lives with significant other.   2 sons, One away at college.   Exercises more than 3 times a week.   Feels safe in her relationship.    Smoke alarm in the home.     Past Medical History:  Diagnosis Date   Anxiety state 05/29/2015   Chest pain 05/29/2015   Depression 04/18/2014   Tick bite 12/19/2019   Past Surgical History:  Procedure Laterality Date   LBTL Bilateral    Family History  Problem Relation Age of Onset   Kidney disease Father    Heart disease Father    Diabetes Father    Allergies as of 07/16/2022       Reactions   Latex Itching        Medication List         Accurate as of July 16, 2022 11:59 PM. If you have any questions, ask your nurse or doctor.          STOP taking these medications    diclofenac 75 MG EC tablet Commonly known as: VOLTAREN Stopped by: Felix Pacini, DO   doxycycline 100 MG tablet Commonly known as: VIBRA-TABS Stopped by: Felix Pacini, DO   LORazepam 1 MG tablet Commonly known as: ATIVAN Stopped by: Felix Pacini, DO   methocarbamol 500 MG tablet Commonly known as: Robaxin Stopped by: Felix Pacini, DO   naproxen 500 MG tablet Commonly known as: Naprosyn Stopped by: Felix Pacini, DO   phentermine 37.5 MG tablet Commonly known as: ADIPEX-P Stopped by: Felix Pacini, DO       TAKE these medications    escitalopram 10 MG tablet Commonly known as: Lexapro Take 1 tablet (10 mg total) by mouth daily. Started by: Felix Pacini, DO   fluticasone 50 MCG/ACT nasal spray Commonly known as: FLONASE Place 1 spray into both nostrils in the morning and at bedtime. Started by: Felix Pacini, DO   meclizine 25 MG tablet Commonly known as: ANTIVERT Take by mouth.   ondansetron 4 MG disintegrating tablet  Commonly known as: ZOFRAN-ODT Take 1 tablet (4 mg total) by mouth every 12 (twelve) hours as needed for nausea or vomiting. What changed:  how much to take when to take this reasons to take this Changed by: Felix Pacini, DO        All past medical history, surgical history, allergies, family history, immunizations andmedications were updated in the EMR today and reviewed under the history and medication portions of their EMR.     ROS Negative, with the exception of above mentioned in HPI   Objective:  BP 125/78   Pulse 82   Temp 98.2 F (36.8 C)   Wt 173 lb 3.2 oz (78.6 kg)   SpO2 100%   BMI 27.13 kg/m  Body mass index is 27.13 kg/m. Physical Exam Vitals and nursing note reviewed.  Constitutional:      General: She is not in acute distress.    Appearance: Normal appearance. She is not  ill-appearing, toxic-appearing or diaphoretic.  HENT:     Head: Normocephalic and atraumatic.     Right Ear: Tympanic membrane, ear canal and external ear normal. There is no impacted cerumen.     Left Ear: Tympanic membrane, ear canal and external ear normal. There is no impacted cerumen.     Nose: No congestion or rhinorrhea.  Eyes:     General: No scleral icterus.       Right eye: No discharge.        Left eye: No discharge.     Extraocular Movements: Extraocular movements intact.     Conjunctiva/sclera: Conjunctivae normal.     Pupils: Pupils are equal, round, and reactive to light.  Cardiovascular:     Rate and Rhythm: Normal rate and regular rhythm.     Heart sounds: No murmur heard. Pulmonary:     Effort: Pulmonary effort is normal. No respiratory distress.     Breath sounds: Normal breath sounds. No wheezing, rhonchi or rales.  Musculoskeletal:     Cervical back: Neck supple. No tenderness.     Right lower leg: No edema.     Left lower leg: No edema.  Lymphadenopathy:     Cervical: No cervical adenopathy.  Skin:    General: Skin is warm and dry.     Coloration: Skin is not jaundiced or pale.     Findings: No erythema or rash.  Neurological:     Mental Status: She is alert and oriented to person, place, and time. Mental status is at baseline.     Motor: No weakness.     Gait: Gait normal.  Psychiatric:        Mood and Affect: Mood normal.        Behavior: Behavior normal.        Thought Content: Thought content normal.        Judgment: Judgment normal.    No results found. No results found. No results found for this or any previous visit (from the past 24 hour(s)).  Assessment/Plan: PAMULA LUTHER is a 51 y.o. female present for OV for  Benign paroxysmal positional vertigo of right ear/labyrinthitis Recommended she decrease the Ativan to half a tab once a day and then stop after 3 to 4 days Continue the daytime meclizine, may take at night only for the next 3  to 4 days and then stop if able May continue Zofran if needed every 8 hours as needed.  Do not take if not needed. Hydrate. Start Flonase nasal spray. - methylPREDNISolone  acetate (DEPO-MEDROL) injection 80 mg - Ambulatory referral to Physical Therapy-[facility] for vestibular rehab  Depression with anxiety Start Lexapro 10 mg daily Follow-up in 5.5 months, sooner if needed.  Reviewed expectations re: course of current medical issues. Discussed self-management of symptoms. Outlined signs and symptoms indicating need for more acute intervention. Patient verbalized understanding and all questions were answered. Patient received an After-Visit Summary.    Orders Placed This Encounter  Procedures   Ambulatory referral to Physical Therapy   Meds ordered this encounter  Medications   ondansetron (ZOFRAN-ODT) 4 MG disintegrating tablet    Sig: Take 1 tablet (4 mg total) by mouth every 12 (twelve) hours as needed for nausea or vomiting.    Dispense:  30 tablet    Refill:  2   escitalopram (LEXAPRO) 10 MG tablet    Sig: Take 1 tablet (10 mg total) by mouth daily.    Dispense:  90 tablet    Refill:  1   fluticasone (FLONASE) 50 MCG/ACT nasal spray    Sig: Place 1 spray into both nostrils in the morning and at bedtime.    Dispense:  16 g    Refill:  6   methylPREDNISolone acetate (DEPO-MEDROL) injection 80 mg   Referral Orders         Ambulatory referral to Physical Therapy       Note is dictated utilizing voice recognition software. Although note has been proof read prior to signing, occasional typographical errors still can be missed. If any questions arise, please do not hesitate to call for verification.   electronically signed by:  Felix Pacini, DO  [facility] [place]

## 2022-07-16 NOTE — Patient Instructions (Addendum)
1/2 tab of ativan once a day, then stop after 3-4 days if ok. Stop daytime meclizine and take at night only. Stop after 3-4 days if able.  I have refilled zofran- for nausea.   Start flonase nasal spray- one spray each side, twice day.  Referred to physical therapy.     Start lexapro 10 mg once a day (everyday) for depression and anxiety. Need to take everyday for it to work. Can take up to 4 weeks to feel full affect.     Benign Positional Vertigo Vertigo is the feeling that you or your surroundings are moving when they are not. Benign positional vertigo is the most common form of vertigo. This is usually a harmless condition (benign). This condition is positional. This means that symptoms are triggered by certain movements and positions. This condition can be dangerous if it occurs while you are doing something that could cause harm to yourself or others. This includes activities such as driving or operating machinery. What are the causes? The inner ear has fluid-filled canals that help your brain sense movement and balance. When the fluid moves, the brain receives messages about your body's position. With benign positional vertigo, calcium crystals in the inner ear break free and disturb the inner ear area. This causes your brain to receive confusing messages about your body's position. What increases the risk? You are more likely to develop this condition if: You are a woman. You are 49 years of age or older. You have recently had a head injury. You have an inner ear disease. What are the signs or symptoms? Symptoms of this condition usually happen when you move your head or your eyes in different directions. Symptoms may start suddenly and usually last for less than a minute. They include: Loss of balance and falling. Feeling like you are spinning or moving. Feeling like your surroundings are spinning or moving. Nausea and vomiting. Blurred vision. Dizziness. Involuntary eye  movement (nystagmus). Symptoms can be mild and cause only minor problems, or they can be severe and interfere with daily life. Episodes of benign positional vertigo may return (recur) over time. Symptoms may also improve over time. How is this diagnosed? This condition may be diagnosed based on: Your medical history. A physical exam of the head, neck, and ears. Positional tests to check for or stimulate vertigo. You may be asked to turn your head and change positions, such as going from sitting to lying down. A health care provider will watch for symptoms of vertigo. You may be referred to a health care provider who specializes in ear, nose, and throat problems (ENT or otolaryngologist) or a provider who specializes in disorders of the nervous system (neurologist). How is this treated?  This condition may be treated in a session in which your health care provider moves your head in specific positions to help the displaced crystals in your inner ear move. Treatment for this condition may take several sessions. Surgery may be needed in severe cases, but this is rare. In some cases, benign positional vertigo may resolve on its own in 2-4 weeks. Follow these instructions at home: Safety Move slowly. Avoid sudden body or head movements or certain positions, as told by your health care provider. Avoid driving or operating machinery until your health care provider says it is safe. Avoid doing any tasks that would be dangerous to you or others if vertigo occurs. If you have trouble walking or keeping your balance, try using a cane for stability. If  you feel dizzy or unstable, sit down right away. Return to your normal activities as told by your health care provider. Ask your health care provider what activities are safe for you. General instructions Take over-the-counter and prescription medicines only as told by your health care provider. Drink enough fluid to keep your urine pale yellow. Keep all  follow-up visits. This is important. Contact a health care provider if: You have a fever. Your condition gets worse or you develop new symptoms. Your family or friends notice any behavioral changes. You have nausea or vomiting that gets worse. You have numbness or a prickling and tingling sensation. Get help right away if you: Have difficulty speaking or moving. Are always dizzy or faint. Develop severe headaches. Have weakness in your legs or arms. Have changes in your hearing or vision. Develop a stiff neck. Develop sensitivity to light. These symptoms may represent a serious problem that is an emergency. Do not wait to see if the symptoms will go away. Get medical help right away. Call your local emergency services (911 in the [place] Do not drive yourself to the hospital. Summary Vertigo is the feeling that you or your surroundings are moving when they are not. Benign positional vertigo is the most common form of vertigo. This condition is caused by calcium crystals in the inner ear that become displaced. This causes a disturbance in an area of the inner ear that helps your brain sense movement and balance. Symptoms include loss of balance and falling, feeling that you or your surroundings are moving, nausea and vomiting, and blurred vision. This condition can be diagnosed based on symptoms, a physical exam, and positional tests. Follow safety instructions as told by your health care provider and keep all follow-up visits. This is important. This information is not intended to replace advice given to you by your health care provider. Make sure you discuss any questions you have with your health care provider. Document Revised: 07/04/2020 Document Reviewed: 07/04/2020 Elsevier Patient Education  2023 [employer].

## 2022-11-25 ENCOUNTER — Encounter: Payer: Self-pay | Admitting: Family Medicine

## 2022-11-25 ENCOUNTER — Ambulatory Visit (INDEPENDENT_AMBULATORY_CARE_PROVIDER_SITE_OTHER): Payer: 59 | Admitting: Family Medicine

## 2022-11-25 VITALS — BP 125/85 | HR 72 | Temp 97.8°F | Wt 188.6 lb

## 2022-11-25 DIAGNOSIS — S5011XA Contusion of right forearm, initial encounter: Secondary | ICD-10-CM

## 2022-11-25 DIAGNOSIS — M545 Low back pain, unspecified: Secondary | ICD-10-CM

## 2022-11-25 DIAGNOSIS — F418 Other specified anxiety disorders: Secondary | ICD-10-CM

## 2022-11-25 DIAGNOSIS — M461 Sacroiliitis, not elsewhere classified: Secondary | ICD-10-CM

## 2022-11-25 DIAGNOSIS — E559 Vitamin D deficiency, unspecified: Secondary | ICD-10-CM

## 2022-11-25 DIAGNOSIS — R635 Abnormal weight gain: Secondary | ICD-10-CM | POA: Diagnosis not present

## 2022-11-25 DIAGNOSIS — T148XXA Other injury of unspecified body region, initial encounter: Secondary | ICD-10-CM

## 2022-11-25 MED ORDER — ESCITALOPRAM OXALATE 10 MG PO TABS: 10.0000 mg | ORAL_TABLET | Freq: Every day | ORAL | 1 refills | Status: DC

## 2022-11-25 MED ORDER — DICLOFENAC SODIUM ER 100 MG PO TB24: 100.0000 mg | ORAL_TABLET | Freq: Every day | ORAL | 1 refills | Status: DC

## 2022-11-25 NOTE — Progress Notes (Unsigned)
Danielle Kelly , [birth year], 52 y.o., female MRN: [id number] Patient Care Team    Relationship Specialty Notifications Start End  Natalia LeatherwoodKuneff, Analise Glotfelty A, DO PCP - General Family Medicine  05/29/15     Chief Complaint  Patient presents with   nerve pain    Back pain; shin splints; easy bruising     Subjective: Danielle Kelly is a 52 y.o. Pt presents for an OV with double complaints.  She reports she is noticing more back pain in her lower back.  She points to the left SI region.  She also notices sensations in her shins that are consistent with shinsplints.  She is concerned she is bruising more easily.  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 She reports she generally feels more achiness in her hips daily. She reports she has gained 30 pounds.  Depression: Patient was restarted on Lexapro last November for depression.  She states this worked rather well for her and she would like to continue.     01/14/2018    9:54 AM  Depression screen PHQ 2/9  Decreased Interest 0  Down, Depressed, Hopeless 0  PHQ - 2 Score 0  Altered sleeping 0  Tired, decreased energy 0  Change in appetite 0  Feeling bad or failure about yourself  0  Trouble concentrating 0  Suicidal thoughts 0  PHQ-9 Score 0    Allergies  Allergen Reactions   Latex Itching   Social History   Social History Narrative   Lives with significant other.   2 sons, One away at college.   Exercises more than 3 times a week.   Feels safe in her relationship.    Smoke alarm in the home.     Past Medical History:  Diagnosis Date   Anxiety state 05/29/2015   Chest pain 05/29/2015   Depression 04/18/2014   Tick bite 12/19/2019   Past Surgical  History:  Procedure Laterality Date   LBTL Bilateral    Family History  Problem Relation Age of Onset   Kidney disease Father    Heart disease Father    Diabetes Father    Allergies as of 11/25/2022       Reactions   Latex Itching        Medication List        Accurate as of November 25, 2022  1:53 PM. If you have any questions, ask your nurse or doctor.          escitalopram 10 MG tablet Commonly known as: Lexapro Take 1 tablet (10 mg total) by mouth daily.   fluticasone 50 MCG/ACT nasal spray Commonly known as: FLONASE Place 1 spray into both nostrils in the morning and at bedtime.   ondansetron 4 MG disintegrating tablet Commonly known as: ZOFRAN-ODT Take 1 tablet (4 mg total) by mouth every 12 (twelve) hours as needed for nausea or vomiting.        All past medical history, surgical history, allergies, family history, immunizations andmedications were updated in the EMR today and reviewed under the history and medication portions of their EMR.     ROS Negative, with the exception of above mentioned in HPI   Objective:  BP 125/85  Pulse 72   Temp 97.8 F (36.6 C)   Wt 188 lb 9.6 oz (85.5 kg)   SpO2 100%   BMI 29.54 kg/m  Body mass index is 29.54 kg/m. Physical Exam Vitals and nursing note reviewed.  Constitutional:      General: She is not in acute distress.    Appearance: Normal appearance. She is normal weight. She is not ill-appearing or toxic-appearing.  HENT:     Head: Normocephalic and atraumatic.  Eyes:     General: No scleral icterus.       Right eye: No discharge.        Left eye: No discharge.     Extraocular Movements: Extraocular movements intact.     Conjunctiva/sclera: Conjunctivae normal.     Pupils: Pupils are equal, round, and reactive to light.  Neck:     Comments: No thyromegaly Cardiovascular:     Rate and Rhythm: Normal rate and regular rhythm.     Heart sounds: No murmur heard. Pulmonary:     Effort: Pulmonary effort  is normal.     Breath sounds: Normal breath sounds.  Musculoskeletal:        General: Tenderness present. No swelling, deformity or signs of injury. Normal range of motion.     Cervical back: Neck supple. No tenderness.     Lumbar back: Spasms and tenderness present. No swelling or bony tenderness. Normal range of motion.       Back:     Right lower leg: No edema.     Left lower leg: No edema.     Comments: Pink: Tenderness to palpation left SI, L4-L5.  No bony tenderness.  Neurovascularly intact distally. green: Severe ropiness  Lymphadenopathy:     Cervical: No cervical adenopathy.  Skin:    General: Skin is warm.     Coloration: Skin is not pale.     Findings: Bruising (Bruising of forearm) present. No rash.  Neurological:     Mental Status: She is alert and oriented to person, place, and time. Mental status is at baseline.     Motor: No weakness.     Coordination: Coordination normal.     Gait: Gait normal.  Psychiatric:        Mood and Affect: Mood normal.        Behavior: Behavior normal.        Thought Content: Thought content normal.        Judgment: Judgment normal.      No results found. No results found. No results found for this or any previous visit (from the past 24 hour(s)).  Assessment/Plan: Danielle Kelly is a 52 y.o. female present for OV for  Bruising She is working on [employer], and loading them off trucks etc.  She is not taking any supplements that would cause her to have increased bruising at this time.  Suspect this may be normal bruising pattern due to activity.  Bruising is located in common areas for bruising secondary to activities etc. - CBC w/Diff - Protime-INR  Sacroiliac inflammation/Lumbar pain Encourage patient to use heat application a few times a day if possible.  She has rather significant muscle spasms and ropiness in the lumbar paraspinal muscles.   She would likely benefit from massage therapy as well. She is tender  over the left SI region, I suspect this is from overuse/rotation.  She is also feeling generalized arthralgia, partially could be secondary to overuse and increased in weight. Trial of diclofenac  daily with food.  Depression with anxiety Stable Continue Lexapro 10 mg daily  Vitamin D deficiency She routinely forgets to take her vitamin D supplement - Vitamin D (25 hydroxy)  Weight gain She is not obtaining any routine exercise at this time.  She is not sedentary secondary to her job which she does not have much activity. Encouraged her to exercise at least 150 minutes a week of dedicated exercise. Will rule out thyroid disorder as a possible cause. Weight gain started prior to start of Lexapro, but increased even more thereafter.  Possibly could be contributing. - TSH  Patient was encouraged to schedule her CPE within the next couple months, we will touch base on the above at that time and devise further plan if necessary.  Reviewed expectations re: course of current medical issues. Discussed self-management of symptoms. Outlined signs and symptoms indicating need for more acute intervention. Patient verbalized understanding and all questions were answered. Patient received an After-Visit Summary.    No orders of the defined types were placed in this encounter.  No orders of the defined types were placed in this encounter.  Referral Orders  No referral(s) requested today     Note is dictated utilizing voice recognition software. Although note has been proof read prior to signing, occasional typographical errors still can be missed. If any questions arise, please do not hesitate to call for verification.   electronically signed by:  Felix Pacini, DO   [facility] [place]

## 2022-11-26 LAB — CBC WITH DIFFERENTIAL/PLATELET
Absolute Monocytes: 422 cells/uL (ref 200–950)
Basophils Absolute: 38 cells/uL (ref 0–200)
Basophils Relative: 0.6 %
Eosinophils Absolute: 302 cells/uL (ref 15–500)
Eosinophils Relative: 4.8 %
HCT: 38.7 % (ref 35.0–45.0)
Hemoglobin: 12.9 g/dL (ref 11.7–15.5)
Lymphs Abs: 1411 cells/uL (ref 850–3900)
MCH: 29.7 pg (ref 27.0–33.0)
MCHC: 33.3 g/dL (ref 32.0–36.0)
MCV: 89.2 fL (ref 80.0–100.0)
MPV: 10.5 fL (ref 7.5–12.5)
Monocytes Relative: 6.7 %
Neutro Abs: 4127 cells/uL (ref 1500–7800)
Neutrophils Relative %: 65.5 %
Platelets: 294 10*3/uL (ref 140–400)
RBC: 4.34 10*6/uL (ref 3.80–5.10)
RDW: 12.1 % (ref 11.0–15.0)
Total Lymphocyte: 22.4 %
WBC: 6.3 10*3/uL (ref 3.8–10.8)

## 2022-11-26 LAB — PROTIME-INR
INR: 1
Prothrombin Time: 10.5 s (ref 9.0–11.5)

## 2022-11-26 LAB — TSH: TSH: 1.12 mIU/L

## 2022-11-26 LAB — VITAMIN D 25 HYDROXY (VIT D DEFICIENCY, FRACTURES): Vit D, 25-Hydroxy: 21 ng/mL — ABNORMAL LOW (ref 30–100)

## 2022-11-27 ENCOUNTER — Encounter: Payer: Self-pay | Admitting: Family Medicine

## 2022-11-27 DIAGNOSIS — M545 Low back pain, unspecified: Secondary | ICD-10-CM | POA: Insufficient documentation

## 2022-11-27 DIAGNOSIS — M461 Sacroiliitis, not elsewhere classified: Secondary | ICD-10-CM | POA: Insufficient documentation

## 2022-11-27 DIAGNOSIS — E559 Vitamin D deficiency, unspecified: Secondary | ICD-10-CM | POA: Insufficient documentation

## 2022-11-27 DIAGNOSIS — T148XXA Other injury of unspecified body region, initial encounter: Secondary | ICD-10-CM | POA: Insufficient documentation

## 2022-11-27 DIAGNOSIS — R635 Abnormal weight gain: Secondary | ICD-10-CM | POA: Insufficient documentation

## 2022-12-12 ENCOUNTER — Encounter: Payer: 59 | Admitting: Family Medicine

## 2022-12-12 NOTE — Patient Instructions (Signed)
No follow-ups on file.        Great to see you today.  I have refilled the medication(s) we provide.   If labs were collected, we will inform you of lab results once received either by echart message or telephone call.   - echart message- for normal results that have been seen by the patient already.   - telephone call: abnormal results or if patient has not viewed results in their echart.  

## 2022-12-12 NOTE — Progress Notes (Signed)
   No show- canceled a few minutes prior to appt. Rescheduled cpe for later date

## 2022-12-15 ENCOUNTER — Ambulatory Visit (INDEPENDENT_AMBULATORY_CARE_PROVIDER_SITE_OTHER): Payer: 59 | Admitting: Family Medicine

## 2022-12-15 ENCOUNTER — Encounter: Payer: Self-pay | Admitting: Family Medicine

## 2022-12-15 VITALS — BP 106/72 | HR 77 | Temp 98.1°F | Ht 67.0 in | Wt 183.6 lb

## 2022-12-15 DIAGNOSIS — Z0000 Encounter for general adult medical examination without abnormal findings: Secondary | ICD-10-CM

## 2022-12-15 DIAGNOSIS — Z23 Encounter for immunization: Secondary | ICD-10-CM | POA: Diagnosis not present

## 2022-12-15 DIAGNOSIS — Z1231 Encounter for screening mammogram for malignant neoplasm of breast: Secondary | ICD-10-CM

## 2022-12-15 DIAGNOSIS — Z1211 Encounter for screening for malignant neoplasm of colon: Secondary | ICD-10-CM

## 2022-12-15 DIAGNOSIS — Z13220 Encounter for screening for lipoid disorders: Secondary | ICD-10-CM

## 2022-12-15 DIAGNOSIS — Z131 Encounter for screening for diabetes mellitus: Secondary | ICD-10-CM

## 2022-12-15 DIAGNOSIS — Z1159 Encounter for screening for other viral diseases: Secondary | ICD-10-CM

## 2022-12-15 DIAGNOSIS — Z114 Encounter for screening for human immunodeficiency virus [HIV]: Secondary | ICD-10-CM

## 2022-12-15 LAB — CBC WITH DIFFERENTIAL/PLATELET
Basophils Absolute: 0.1 10*3/uL (ref 0.0–0.1)
Basophils Relative: 0.9 % (ref 0.0–3.0)
Eosinophils Absolute: 0.2 10*3/uL (ref 0.0–0.7)
Eosinophils Relative: 3.7 % (ref 0.0–5.0)
HCT: 39.7 % (ref 36.0–46.0)
Hemoglobin: 13.5 g/dL (ref 12.0–15.0)
Lymphocytes Relative: 25.3 % (ref 12.0–46.0)
Lymphs Abs: 1.7 10*3/uL (ref 0.7–4.0)
MCHC: 34 g/dL (ref 30.0–36.0)
MCV: 89.4 fl (ref 78.0–100.0)
Monocytes Absolute: 0.4 10*3/uL (ref 0.1–1.0)
Monocytes Relative: 6.1 % (ref 3.0–12.0)
Neutro Abs: 4.2 10*3/uL (ref 1.4–7.7)
Neutrophils Relative %: 64 % (ref 43.0–77.0)
Platelets: 326 10*3/uL (ref 150.0–400.0)
RBC: 4.44 Mil/uL (ref 3.87–5.11)
RDW: 13.6 % (ref 11.5–15.5)
WBC: 6.5 10*3/uL (ref 4.0–10.5)

## 2022-12-15 LAB — COMPREHENSIVE METABOLIC PANEL
ALT: 10 U/L (ref 0–35)
AST: 13 U/L (ref 0–37)
Albumin: 4.4 g/dL (ref 3.5–5.2)
Alkaline Phosphatase: 46 U/L (ref 39–117)
BUN: 11 mg/dL (ref 6–23)
CO2: 31 mEq/L (ref 19–32)
Calcium: 9.4 mg/dL (ref 8.4–10.5)
Chloride: 101 mEq/L (ref 96–112)
Creatinine, Ser: 0.71 mg/dL (ref 0.40–1.20)
GFR: 98.2 mL/min (ref >60.00)
Glucose, Bld: 84 mg/dL (ref 70–99)
Potassium: 3.9 mEq/L (ref 3.5–5.1)
Sodium: 140 mEq/L (ref 135–145)
Total Bilirubin: 0.4 mg/dL (ref 0.2–1.2)
Total Protein: 6.7 g/dL (ref 6.0–8.3)

## 2022-12-15 LAB — LIPID PANEL
Cholesterol: 189 mg/dL (ref 0–200)
HDL: 51.4 mg/dL (ref >39.00)
LDL Cholesterol: 106 mg/dL — ABNORMAL HIGH (ref 0–99)
NonHDL: 137.92
Total CHOL/HDL Ratio: 4
Triglycerides: 161 mg/dL — ABNORMAL HIGH (ref 0.0–149.0)
VLDL: 32.2 mg/dL (ref 0.0–40.0)

## 2022-12-15 LAB — TSH: TSH: 0.95 u[IU]/mL (ref 0.35–5.50)

## 2022-12-15 LAB — HEMOGLOBIN A1C: Hgb A1c MFr Bld: 5.3 % (ref 4.6–6.5)

## 2022-12-15 MED ORDER — TIZANIDINE HCL 4 MG PO TABS
4.0000 mg | ORAL_TABLET | Freq: Three times a day (TID) | ORAL | 1 refills | Status: DC
Start: 2022-12-15 — End: 2023-12-16

## 2022-12-15 NOTE — Progress Notes (Unsigned)
Patient ID: Danielle Kelly, female  DOB: Feb 13, 1971, 52 y.o.   MRN: [id number] Patient Care Team    Relationship Specialty Notifications Start End  Natalia Leatherwood, DO PCP - General Family Medicine  05/29/15   Dorna Leitz, MD Referring Physician Internal Medicine  12/15/22   Reynold Bowen., MD  Obstetrics and Gynecology  12/15/22     Chief Complaint  Patient presents with   Annual Exam    Pt is not fasting; states she fell since last appt; is aware she will likely need to schedule acute appt    Subjective: Danielle Kelly is a 52 y.o.  Female  present for CPE. All past medical history, surgical history, allergies, family history, immunizations, medications and social history were updated in the electronic medical record today. All recent labs, ED visits and hospitalizations within the last year were reviewed.  Health maintenance:  Colonoscopy: No family history, UTD 04/2022, Dr. Octaviano Glow 10 yr follow up Mammogram: No family history.  Completed 03/13/2022 Cervical cancer screening: last pap: 08/27/2020, completed by: Dr. Concha Se Immunizations: tdap UTD 2023 per pt, Influenza  (encouraged yearly), shingrix #1 today- nurse visit 3-6 mos for #2 Infectious disease screening: HIV and Hep C declined DEXA: Routine screening Assistive device: None Oxygen use: None Patient has a Dental home. Hospitalizations/ED visits: Reviewed     12/15/2022    1:21 PM 01/14/2018    9:54 AM  Depression screen PHQ 2/9  Decreased Interest 0 0  Down, Depressed, Hopeless 0 0  PHQ - 2 Score 0 0  Altered sleeping 2 0  Tired, decreased energy 2 0  Change in appetite 0 0  Feeling bad or failure about yourself  0 0  Trouble concentrating 2 0  Moving slowly or fidgety/restless 0   Suicidal thoughts 0 0  PHQ-9 Score 6 0  Difficult doing work/chores Somewhat difficult       12/15/2022    1:22 PM  GAD 7 : Generalized Anxiety Score  Nervous, Anxious, on Edge 1  Control/stop worrying 1   Worry too much - different things 1  Trouble relaxing 1  Restless 0  Easily annoyed or irritable 1  Afraid - awful might happen 1  Total GAD 7 Score 6  Anxiety Difficulty Not difficult at all    Immunization History  Administered Date(s) Administered   Moderna Sars-Covid-2 Vaccination 11/05/2019, 12/07/2019   Zoster Recombinat (Shingrix) 12/15/2022    Past Medical History:  Diagnosis Date   Anxiety state 05/29/2015   Chest pain 05/29/2015   Depression 04/18/2014   Tick bite 12/19/2019   Allergies  Allergen Reactions   Latex Itching   Past Surgical History:  Procedure Laterality Date   LBTL Bilateral    Family History  Problem Relation Age of Onset   Kidney disease Father    Heart disease Father    Diabetes Father    Social History   Social History Narrative   Lives with significant other.   2 sons, One away at college.   Exercises more than 3 times a week.   Feels safe in her relationship.    Smoke alarm in the home.      Allergies as of 12/15/2022       Reactions   Latex Itching        Medication List        Accurate as of December 15, 2022 11:59 PM. If you have any questions, ask your nurse or doctor.  Diclofenac Sodium CR 100 MG 24 hr tablet Take 1 tablet (100 mg total) by mouth daily.   escitalopram 10 MG tablet Commonly known as: Lexapro Take 1 tablet (10 mg total) by mouth daily.   fluticasone 50 MCG/ACT nasal spray Commonly known as: FLONASE Place 1 spray into both nostrils in the morning and at bedtime.   tiZANidine 4 MG tablet Commonly known as: Zanaflex Take 1 tablet (4 mg total) by mouth 3 (three) times daily. Started by: Felix Pacini, DO        All past medical history, surgical history, allergies, family history, immunizations andmedications were updated in the EMR today and reviewed under the history and medication portions of their EMR.      ROS 14 pt review of systems performed and negative (unless mentioned  in an HPI)  Objective: BP 106/72   Pulse 77   Temp 98.1 F (36.7 C)   Ht 5\' 7"  (1.702 m)   Wt 183 lb 9.6 oz (83.3 kg)   SpO2 100%   BMI 28.76 kg/m  Physical Exam Vitals and nursing note reviewed.  Constitutional:      General: She is not in acute distress.    Appearance: Normal appearance. She is not ill-appearing or toxic-appearing.  HENT:     Head: Normocephalic and atraumatic.     Right Ear: Tympanic membrane, ear canal and external ear normal. There is no impacted cerumen.     Left Ear: Tympanic membrane, ear canal and external ear normal. There is no impacted cerumen.     Nose: No congestion or rhinorrhea.     Mouth/Throat:     Mouth: Mucous membranes are moist.     Pharynx: Oropharynx is clear. No oropharyngeal exudate or posterior oropharyngeal erythema.  Eyes:     General: No scleral icterus.       Right eye: No discharge.        Left eye: No discharge.     Extraocular Movements: Extraocular movements intact.     Conjunctiva/sclera: Conjunctivae normal.     Pupils: Pupils are equal, round, and reactive to light.  Cardiovascular:     Rate and Rhythm: Normal rate and regular rhythm.     Pulses: Normal pulses.     Heart sounds: Normal heart sounds. No murmur heard.    No friction rub. No gallop.  Pulmonary:     Effort: Pulmonary effort is normal. No respiratory distress.     Breath sounds: Normal breath sounds. No stridor. No wheezing, rhonchi or rales.  Chest:     Chest wall: No tenderness.  Abdominal:     General: Abdomen is flat. Bowel sounds are normal. There is no distension.     Palpations: Abdomen is soft. There is no mass.     Tenderness: There is no abdominal tenderness. There is no right CVA tenderness, left CVA tenderness, guarding or rebound.     Hernia: No hernia is present.  Musculoskeletal:        General: No swelling, tenderness or deformity. Normal range of motion.     Cervical back: Normal range of motion and neck supple. No rigidity or  tenderness.     Right lower leg: No edema.     Left lower leg: No edema.  Lymphadenopathy:     Cervical: No cervical adenopathy.  Skin:    General: Skin is warm and dry.     Coloration: Skin is not jaundiced or pale.     Findings: No bruising, erythema, lesion or  rash.  Neurological:     General: No focal deficit present.     Mental Status: She is alert and oriented to person, place, and time. Mental status is at baseline.     Cranial Nerves: No cranial nerve deficit.     Sensory: No sensory deficit.     Motor: No weakness.     Coordination: Coordination normal.     Gait: Gait normal.     Deep Tendon Reflexes: Reflexes normal.  Psychiatric:        Mood and Affect: Mood normal.        Behavior: Behavior normal.        Thought Content: Thought content normal.        Judgment: Judgment normal.      No results found.  Assessment/plan: PENIEL BIEL is a 52 y.o. female present for CPE  Need for zoster vaccination - Varicella-zoster vaccine IM Lipid screening - Lipid panel Diabetes mellitus screening - Hemoglobin A1c Routine general medical examination at a health care facility - CBC with Differential/Platelet - Comprehensive metabolic panel - Hemoglobin A1c - Lipid panel - TSH Patient was encouraged to exercise greater than 150 minutes a week. Patient was encouraged to choose a diet filled with fresh fruits and vegetables, and lean meats. AVS provided to patient today for education/recommendation on gender specific health and safety maintenance. Colonoscopy: No family history, UTD 04/2022, Dr. Octaviano Glow 10 yr follow up Mammogram: No family history.  Completed 03/13/2022 Cervical cancer screening: last pap: 08/27/2020, completed by: Dr. Concha Se Immunizations: tdap UTD 2023 per pt, Influenza  (encouraged yearly), shingrix #1 today- nurse visit 3-6 mos for #2 Infectious disease screening: HIV and Hep C declined DEXA: Routine screening  Return in about 1 year (around  12/16/2023) for cpe (20 min).   Orders Placed This Encounter  Procedures   Varicella-zoster vaccine IM   CBC with Differential/Platelet   Comprehensive metabolic panel   Hemoglobin A1c   Lipid panel   TSH   Meds ordered this encounter  Medications   tiZANidine (ZANAFLEX) 4 MG tablet    Sig: Take 1 tablet (4 mg total) by mouth 3 (three) times daily.    Dispense:  90 tablet    Refill:  1   Referral Orders  No referral(s) requested today     Electronically signed by: Felix Pacini, DO [facility] [place]

## 2022-12-15 NOTE — Patient Instructions (Addendum)
Return in about 1 year (around 12/16/2023) for cpe (20 min).        Great to see you today.  I have refilled the medication(s) we provide.   If labs were collected, we will inform you of lab results once received either by echart message or telephone call.   - echart message- for normal results that have been seen by the patient already.   - telephone call: abnormal results or if patient has not viewed results in their echart.

## 2022-12-24 ENCOUNTER — Ambulatory Visit: Payer: 59 | Admitting: Family Medicine

## 2022-12-24 NOTE — Progress Notes (Deleted)
Danielle Kelly , 04/18/1971, 52 y.o., female MRN: [id number] Patient Care Team    Relationship Specialty Notifications Start End  Natalia Leatherwood, DO PCP - General Family Medicine  05/29/15   Dorna Leitz, MD Referring Physician Internal Medicine  12/15/22   Reynold Bowen., MD  Obstetrics and Gynecology  12/15/22     No chief complaint on file.    Subjective: Danielle Kelly is a 52 y.o. Pt presents for an OV with complaints of lumbar back pain of *** duration.  Associated symptoms include ***. She has been prescribed diclofenac and Zanaflex***      12/15/2022    1:21 PM 01/14/2018    9:54 AM  Depression screen PHQ 2/9  Decreased Interest 0 0  Down, Depressed, Hopeless 0 0  PHQ - 2 Score 0 0  Altered sleeping 2 0  Tired, decreased energy 2 0  Change in appetite 0 0  Feeling bad or failure about yourself  0 0  Trouble concentrating 2 0  Moving slowly or fidgety/restless 0   Suicidal thoughts 0 0  PHQ-9 Score 6 0  Difficult doing work/chores Somewhat difficult     Allergies  Allergen Reactions   Latex Itching   Social History   Social History Narrative   Lives with significant other.   2 sons, One away at college.   Exercises more than 3 times a week.   Feels safe in her relationship.    Smoke alarm in the home.     Past Medical History:  Diagnosis Date   Anxiety state 05/29/2015   Chest pain 05/29/2015   Depression 04/18/2014   Tick bite 12/19/2019   Past Surgical History:  Procedure Laterality Date   LBTL Bilateral    Family History  Problem Relation Age of Onset   Kidney disease Father    Heart disease Father    Diabetes Father    Allergies as of 12/24/2022       Reactions   Latex Itching        Medication List        Accurate as of Dec 24, 2022  8:04 AM. If you have any questions, ask your nurse or doctor.          Diclofenac Sodium CR 100 MG 24 hr tablet Take 1 tablet (100 mg total) by mouth daily.   escitalopram 10  MG tablet Commonly known as: Lexapro Take 1 tablet (10 mg total) by mouth daily.   fluticasone 50 MCG/ACT nasal spray Commonly known as: FLONASE Place 1 spray into both nostrils in the morning and at bedtime.   tiZANidine 4 MG tablet Commonly known as: Zanaflex Take 1 tablet (4 mg total) by mouth 3 (three) times daily.        All past medical history, surgical history, allergies, family history, immunizations andmedications were updated in the EMR today and reviewed under the history and medication portions of their EMR.     ROS Negative, with the exception of above mentioned in HPI   Objective:  There were no vitals taken for this visit. There is no height or weight on file to calculate BMI. Physical Exam Vitals and nursing note reviewed.  Constitutional:      General: She is not in acute distress.    Appearance: Normal appearance. She is normal weight. She is not ill-appearing or toxic-appearing.  HENT:     Head: Normocephalic and atraumatic.  Eyes:     General:  No scleral icterus.       Right eye: No discharge.        Left eye: No discharge.     Extraocular Movements: Extraocular movements intact.     Conjunctiva/sclera: Conjunctivae normal.     Pupils: Pupils are equal, round, and reactive to light.  Musculoskeletal:     Comments: ***  Skin:    Findings: No rash.  Neurological:     Mental Status: She is alert and oriented to person, place, and time. Mental status is at baseline.     Motor: No weakness.     Coordination: Coordination normal.     Gait: Gait normal.  Psychiatric:        Mood and Affect: Mood normal.        Behavior: Behavior normal.        Thought Content: Thought content normal.        Judgment: Judgment normal.      No results found. No results found. No results found for this or any previous visit (from the past 24 hour(s)).  Assessment/Plan: Danielle Kelly is a 52 y.o. female present for OV for  *** Reviewed expectations re:  course of current medical issues. Discussed self-management of symptoms. Outlined signs and symptoms indicating need for more acute intervention. Patient verbalized understanding and all questions were answered. Patient received an After-Visit Summary.    No orders of the defined types were placed in this encounter.  No orders of the defined types were placed in this encounter.  Referral Orders  No referral(s) requested today     Note is dictated utilizing voice recognition software. Although note has been proof read prior to signing, occasional typographical errors still can be missed. If any questions arise, please do not hesitate to call for verification.   electronically signed by:  Felix Pacini, DO  [facility] [place]

## 2022-12-29 ENCOUNTER — Ambulatory Visit (INDEPENDENT_AMBULATORY_CARE_PROVIDER_SITE_OTHER): Payer: 59 | Admitting: Family Medicine

## 2022-12-29 ENCOUNTER — Ambulatory Visit (HOSPITAL_BASED_OUTPATIENT_CLINIC_OR_DEPARTMENT_OTHER)
Admission: RE | Admit: 2022-12-29 | Discharge: 2022-12-29 | Disposition: A | Discharge status: Home / Self Care | Payer: 59 | Source: Ambulatory Visit | Attending: Family Medicine | Admitting: Family Medicine

## 2022-12-29 ENCOUNTER — Encounter: Payer: Self-pay | Admitting: Family Medicine

## 2022-12-29 VITALS — BP 112/78 | HR 66 | Temp 97.6°F | Wt 186.4 lb

## 2022-12-29 DIAGNOSIS — M25562 Pain in left knee: Secondary | ICD-10-CM

## 2022-12-29 DIAGNOSIS — M545 Low back pain, unspecified: Secondary | ICD-10-CM | POA: Insufficient documentation

## 2022-12-29 DIAGNOSIS — M25561 Pain in right knee: Secondary | ICD-10-CM

## 2022-12-29 DIAGNOSIS — M5440 Lumbago with sciatica, unspecified side: Secondary | ICD-10-CM

## 2022-12-29 MED ORDER — PREDNISONE 20 MG PO TABS: ORAL_TABLET | ORAL | 0 refills | Status: DC

## 2022-12-29 MED ORDER — DICLOFENAC SODIUM ER 100 MG PO TB24: 100.0000 mg | ORAL_TABLET | Freq: Every day | ORAL | 1 refills | Status: AC

## 2022-12-29 NOTE — Progress Notes (Deleted)
Danielle Kelly , 1971/03/09, 52 y.o., female MRN: [id number] Patient Care Team    Relationship Specialty Notifications Start End  Natalia Leatherwood, DO PCP - General Family Medicine  05/29/15   Dorna Leitz, MD Referring Physician Internal Medicine  12/15/22   Reynold Bowen., MD  Obstetrics and Gynecology  12/15/22     No chief complaint on file.    Subjective: Danielle Kelly is a 52 y.o. Pt presents for an OV with complaints of *** of *** duration.  Associated symptoms include ***.  Pt has tried *** to ease their symptoms.      12/15/2022    1:21 PM 01/14/2018    9:54 AM  Depression screen PHQ 2/9  Decreased Interest 0 0  Down, Depressed, Hopeless 0 0  PHQ - 2 Score 0 0  Altered sleeping 2 0  Tired, decreased energy 2 0  Change in appetite 0 0  Feeling bad or failure about yourself  0 0  Trouble concentrating 2 0  Moving slowly or fidgety/restless 0   Suicidal thoughts 0 0  PHQ-9 Score 6 0  Difficult doing work/chores Somewhat difficult     Allergies  Allergen Reactions   Latex Itching   Social History   Social History Narrative   Lives with significant other.   2 sons, One away at college.   Exercises more than 3 times a week.   Feels safe in her relationship.    Smoke alarm in the home.     Past Medical History:  Diagnosis Date   Anxiety state 05/29/2015   Chest pain 05/29/2015   Depression 04/18/2014   Tick bite 12/19/2019   Past Surgical History:  Procedure Laterality Date   LBTL Bilateral    Family History  Problem Relation Age of Onset   Kidney disease Father    Heart disease Father    Diabetes Father    Allergies as of 12/29/2022       Reactions   Latex Itching        Medication List        Accurate as of Dec 29, 2022  7:38 AM. If you have any questions, ask your nurse or doctor.          Diclofenac Sodium CR 100 MG 24 hr tablet Take 1 tablet (100 mg total) by mouth daily.   escitalopram 10 MG  tablet Commonly known as: Lexapro Take 1 tablet (10 mg total) by mouth daily.   fluticasone 50 MCG/ACT nasal spray Commonly known as: FLONASE Place 1 spray into both nostrils in the morning and at bedtime.   tiZANidine 4 MG tablet Commonly known as: Zanaflex Take 1 tablet (4 mg total) by mouth 3 (three) times daily.        All past medical history, surgical history, allergies, family history, immunizations andmedications were updated in the EMR today and reviewed under the history and medication portions of their EMR.     ROS Negative, with the exception of above mentioned in HPI   Objective:  There were no vitals taken for this visit. There is no height or weight on file to calculate BMI.  Physical Exam   No results found. No results found. No results found for this or any previous visit (from the past 24 hour(s)).  Assessment/Plan: Danielle Kelly is a 52 y.o. female present for OV for  *** Reviewed expectations re: course of current medical issues. Discussed self-management of symptoms. Outlined  signs and symptoms indicating need for more acute intervention. Patient verbalized understanding and all questions were answered. Patient received an After-Visit Summary.    No orders of the defined types were placed in this encounter.  No orders of the defined types were placed in this encounter.  Referral Orders  No referral(s) requested today     Note is dictated utilizing voice recognition software. Although note has been proof read prior to signing, occasional typographical errors still can be missed. If any questions arise, please do not hesitate to call for verification.   electronically signed by:  Felix Pacini, DO  [facility] [place]

## 2022-12-29 NOTE — Progress Notes (Signed)
Danielle Kelly , 14-Aug-1971, 52 y.o., female MRN: [id number] Patient Care Team    Relationship Specialty Notifications Start End  Natalia Leatherwood, DO PCP - General Family Medicine  05/29/15   Dorna Leitz, MD Referring Physician Internal Medicine  12/15/22   Reynold Bowen., MD  Obstetrics and Gynecology  12/15/22     Chief Complaint  Patient presents with   Back Pain    Fall 3 weeks ago     Subjective: Danielle Kelly is a 52 y.o. Pt presents for an OV to discuss back pain  She reports she is noticing more back pain in her lower back.  She points to the left SI region.    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 She reports she generally feels more achiness in her hips daily. Approximately 3 weeks ago she also fell and landed on her knees.  She states since that time both of her knees are sore and she has difficulty squatting, and especially standing straight up after squatting.  She feels her knees are weak and painful. Her lumbar spine is still achy and now more so in the middle of her back and radiates down the left leg to knee. Reports a family history of osteoarthritis in her mother that was rather severe.     12/15/2022    1:21 PM 01/14/2018    9:54 AM  Depression screen PHQ 2/9  Decreased Interest 0 0  Down, Depressed, Hopeless 0 0  PHQ - 2 Score 0 0  Altered sleeping 2 0  Tired, decreased energy 2 0  Change in appetite 0 0  Feeling bad or failure about yourself  0 0  Trouble concentrating 2 0  Moving slowly or fidgety/restless 0   Suicidal thoughts 0 0  PHQ-9 Score 6 0  Difficult doing work/chores Somewhat difficult     Allergies  Allergen Reactions   Latex Itching   Social History    Social History Narrative   Lives with significant other.   2 sons, One away at college.   Exercises more than 3 times a week.   Feels safe in her relationship.    Smoke alarm in the home.     Past Medical History:  Diagnosis Date   Anxiety state 05/29/2015   Chest pain 05/29/2015   Depression 04/18/2014   Tick bite 12/19/2019   Past Surgical History:  Procedure Laterality Date   LBTL Bilateral    Family History  Problem Relation Age of Onset   Arthritis Mother    Kidney disease Father    Heart disease Father    Diabetes Father    Allergies as of 12/29/2022       Reactions   Latex Itching        Medication List        Accurate as of Dec 29, 2022 12:14 PM. If you have any questions, ask your nurse or doctor.          STOP taking these medications    fluticasone 50 MCG/ACT nasal spray Commonly known as: FLONASE Stopped by: Felix Pacini, DO       TAKE these medications    Diclofenac Sodium CR 100 MG 24 hr  tablet Take 1 tablet (100 mg total) by mouth daily.   escitalopram 10 MG tablet Commonly known as: Lexapro Take 1 tablet (10 mg total) by mouth daily.   predniSONE 20 MG tablet Commonly known as: DELTASONE 60 mg x3d, 40 mg x3d, 20 mg x2d, 10 mg x2d Started by: Felix Pacini, DO   tiZANidine 4 MG tablet Commonly known as: Zanaflex Take 1 tablet (4 mg total) by mouth 3 (three) times daily.        All past medical history, surgical history, allergies, family history, immunizations andmedications were updated in the EMR today and reviewed under the history and medication portions of their EMR.     ROS Negative, with the exception of above mentioned in HPI  Objective:  BP 112/78   Pulse 66   Temp 97.6 F (36.4 C)   Wt 186 lb 6.4 oz (84.6 kg)   SpO2 99%   BMI 29.19 kg/m  Body mass index is 29.19 kg/m. Physical Exam Vitals and nursing note reviewed.  Constitutional:      General: She is not in acute distress.    Appearance: Normal  appearance. She is not ill-appearing, toxic-appearing or diaphoretic.  HENT:     Head: Normocephalic and atraumatic.  Eyes:     General: No scleral icterus.       Right eye: No discharge.        Left eye: No discharge.     Extraocular Movements: Extraocular movements intact.     Conjunctiva/sclera: Conjunctivae normal.     Pupils: Pupils are equal, round, and reactive to light.  Cardiovascular:     Rate and Rhythm: Normal rate and regular rhythm.  Pulmonary:     Effort: Pulmonary effort is normal. No respiratory distress.     Breath sounds: Normal breath sounds. No wheezing, rhonchi or rales.  Musculoskeletal:     Lumbar back: Spasms and tenderness present. No swelling, deformity or bony tenderness. Decreased range of motion. Negative right straight leg raise test and negative left straight leg raise test.     Right knee: Effusion and crepitus present. No erythema. Normal range of motion. Tenderness present.     Left knee: Effusion and crepitus present. No erythema. Normal range of motion. Tenderness present.     Right lower leg: No edema.     Left lower leg: No edema.     Comments: Discomfort with side bending range of motion lumbar spine Muscle strength 5/5 bilateral lower extremity with the exception of 4+/5 left hip flexion with discomfort.  FABRE: Right positive for SI discomfort.  Left discomfort inferior lateral hip. Tender to palpation left SI.  Skin:    General: Skin is warm.     Findings: No rash.  Neurological:     Mental Status: She is alert and oriented to person, place, and time. Mental status is at baseline.     Motor: No weakness.     Gait: Gait normal.  Psychiatric:        Mood and Affect: Mood normal.        Behavior: Behavior normal.        Thought Content: Thought content normal.        Judgment: Judgment normal.      No results found. No results found. No results found for this or any previous visit (from the past 24  hour(s)).  Assessment/Plan: Danielle Kelly is a 52 y.o. female present for OV for  Back pain of lumbar region with sciatica -  DG Lumbar Spine Complete; Future Acute pain of left knee/Acute pain of both knees - DG Knee Complete 4 Views Left; Future Original complaint of lumbar back pain was originating in the SI joints until recent fall 3 weeks ago.  Since that time she has lumbar back pain that radiates down the left leg to knee and bilateral knee pain. Will start with x-rays of lumbar and left knee today. Encouraged her to take the diclofenac with food daily to help control symptoms. Steroid taper for acute injury of back and knees. We discussed possible referral to orthopedics or sports med for her condition.  She may also benefit from physical therapy depending upon x-ray results.   Reviewed expectations re: course of current medical issues. Discussed self-management of symptoms. Outlined signs and symptoms indicating need for more acute intervention. Patient verbalized understanding and all questions were answered. Patient received an After-Visit Summary.    Orders Placed This Encounter  Procedures   DG Lumbar Spine Complete   DG Knee Complete 4 Views Left   Meds ordered this encounter  Medications   predniSONE (DELTASONE) 20 MG tablet    Sig: 60 mg x3d, 40 mg x3d, 20 mg x2d, 10 mg x2d    Dispense:  18 tablet    Refill:  0   Diclofenac Sodium CR 100 MG 24 hr tablet    Sig: Take 1 tablet (100 mg total) by mouth daily.    Dispense:  90 tablet    Refill:  1   Referral Orders  No referral(s) requested today     Note is dictated utilizing voice recognition software. Although note has been proof read prior to signing, occasional typographical errors still can be missed. If any questions arise, please do not hesitate to call for verification.   electronically signed by:  Felix Pacini, DO  [facility] [place]

## 2023-01-01 ENCOUNTER — Encounter: Payer: Self-pay | Admitting: Family Medicine

## 2023-01-01 ENCOUNTER — Telehealth: Payer: Self-pay

## 2023-01-01 NOTE — Telephone Encounter (Signed)
Patient calling about MRI results.  Please call tomorrow 5/17.  Patient can be reached at [phone]

## 2023-01-01 NOTE — Telephone Encounter (Signed)
No MRI imaging noted, pt did have left knee XR and lumbar spine. Provider has not reviewed results. She will receive a call once completed.

## 2023-01-02 ENCOUNTER — Telehealth: Payer: Self-pay | Admitting: Family Medicine

## 2023-01-02 DIAGNOSIS — M170 Bilateral primary osteoarthritis of knee: Secondary | ICD-10-CM

## 2023-01-02 DIAGNOSIS — M5137 Other intervertebral disc degeneration, lumbosacral region: Secondary | ICD-10-CM | POA: Insufficient documentation

## 2023-01-02 DIAGNOSIS — M47817 Spondylosis without myelopathy or radiculopathy, lumbosacral region: Secondary | ICD-10-CM | POA: Insufficient documentation

## 2023-01-02 NOTE — Telephone Encounter (Signed)
Spoke with patient regarding results/recommendations.  

## 2023-01-02 NOTE — Telephone Encounter (Signed)
Please inform patient the following information: Xrays of lower back resulted with mild arthritic changes involving the lowest lumbar vertebrae, mostly of the left side.   Knee xray also has arthritis changes on the inner portion of the knee and behind the knee cap with bone spurring. It is mildly irritated with a small amount fluid behind the knee.   The steroid prescribed should help improve her symptoms and the diclofenac daily should help keep her arthritis symptoms better controled, since the arthritis is mild.   If symptoms are not adequately controlled in 4 weeks, then follow up at that time and we can consider more advanced imaging and referral if appropriate

## 2023-01-20 ENCOUNTER — Ambulatory Visit (INDEPENDENT_AMBULATORY_CARE_PROVIDER_SITE_OTHER): Payer: 59 | Admitting: Family Medicine

## 2023-01-20 VITALS — BP 107/69 | HR 72 | Temp 97.5°F | Wt 187.6 lb

## 2023-01-20 DIAGNOSIS — M47817 Spondylosis without myelopathy or radiculopathy, lumbosacral region: Secondary | ICD-10-CM

## 2023-01-20 DIAGNOSIS — M5137 Other intervertebral disc degeneration, lumbosacral region: Secondary | ICD-10-CM | POA: Diagnosis not present

## 2023-01-20 DIAGNOSIS — R29818 Other symptoms and signs involving the nervous system: Secondary | ICD-10-CM

## 2023-01-20 DIAGNOSIS — M5416 Radiculopathy, lumbar region: Secondary | ICD-10-CM

## 2023-01-20 DIAGNOSIS — M545 Low back pain, unspecified: Secondary | ICD-10-CM

## 2023-01-20 DIAGNOSIS — M461 Sacroiliitis, not elsewhere classified: Secondary | ICD-10-CM

## 2023-01-20 DIAGNOSIS — M170 Bilateral primary osteoarthritis of knee: Secondary | ICD-10-CM

## 2023-01-20 MED ORDER — PREGABALIN 75 MG PO CAPS
75.0000 mg | ORAL_CAPSULE | Freq: Two times a day (BID) | ORAL | 5 refills | Status: DC
Start: 2023-01-20 — End: 2023-12-16

## 2023-01-20 NOTE — Progress Notes (Signed)
Danielle Kelly , [birth year], 52 y.o., female MRN: [id number] Patient Care Team    Relationship Specialty Notifications Start End  Natalia Leatherwood, DO PCP - General Family Medicine  05/29/15   Dorna Leitz, MD Referring Physician Internal Medicine  12/15/22   Reynold Bowen., MD  Obstetrics and Gynecology  12/15/22     Chief Complaint  Patient presents with   Leg Pain    After prednisone was finished pain came back, felt like cramping pain. Went to [facility] and they prescribed tramadol. Experiencing numbness and still in some pain.     Subjective: Danielle Kelly is a 52 y.o. Pt presents for an OV to discuss back pain.  She was seen 12/29/2022 for lumbar region back pain radiating to her left leg.  X-ray was completed which resulted with arthritic changes L5-S1.  Patient was prescribed diclofenac, Zanaflex and prednisone.  She states the day after the prednisone taper was completed her symptoms returned with a vengeance.  She ended up being seen in an urgent care and provided with tramadol every 6 hours.  She reports it was helpful initially but it is not working as well as it had initially. She states the pain is causing a feeling of a charley horse in her thigh and calf and her left foot is numb.  She is tolerating the diclofenac and Zanaflex.  Of note x-rays were also obtained of her knees which showed arthritis changes on the inner portion of the knee and behind the knee cap with bone spurring. It is mildly irritated with a small amount fluid behind the knee.   Prior note: She reports she is noticing more back pain in her lower back.  She points to the left SI region.    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  She states she does not feel she was anything too heavy but she does rotate lower back when collecting the chickens She has known moderate cervical spine degeneration with  disc protrusions. She reports she generally feels more achiness in her hips daily. Approximately 3 weeks ago she also fell and landed on her knees.  She states since that time both of her knees are sore and she has difficulty squatting, and especially standing straight up after squatting.  She feels her knees are weak and painful. Her lumbar spine is still achy and now more so in the middle of her back and radiates down the left leg to knee. Reports a family history of osteoarthritis in her mother that was rather severe.     12/15/2022    1:21 PM 01/14/2018    9:54 AM  Depression screen PHQ 2/9  Decreased Interest 0 0  Down, Depressed, Hopeless 0 0  PHQ - 2 Score 0 0  Altered sleeping 2 0  Tired, decreased energy 2 0  Change in appetite 0 0  Feeling bad or failure about yourself  0 0  Trouble concentrating 2 0  Moving slowly or fidgety/restless 0   Suicidal thoughts 0 0  PHQ-9 Score 6 0  Difficult doing work/chores Somewhat difficult     Allergies  Allergen Reactions   Latex Itching   Social History   Social History Narrative   Lives with significant other.   2 sons, One away at college.   Exercises more than 3 times a week.   Feels safe in her  relationship.    Smoke alarm in the home.     Past Medical History:  Diagnosis Date   Anxiety state 05/29/2015   Chest pain 05/29/2015   Depression 04/18/2014   Tick bite 12/19/2019   Past Surgical History:  Procedure Laterality Date   LBTL Bilateral    Family History  Problem Relation Age of Onset   Arthritis Mother    Kidney disease Father    Heart disease Father    Diabetes Father    Allergies as of 01/20/2023       Reactions   Latex Itching        Medication List        Accurate as of January 20, 2023 11:38 AM. If you have any questions, ask your nurse or doctor.          Diclofenac Sodium CR 100 MG 24 hr tablet Take 1 tablet (100 mg total) by mouth daily.   escitalopram 10 MG tablet Commonly known as:  Lexapro Take 1 tablet (10 mg total) by mouth daily.   predniSONE 20 MG tablet Commonly known as: DELTASONE 60 mg x3d, 40 mg x3d, 20 mg x2d, 10 mg x2d   pregabalin 75 MG capsule Commonly known as: LYRICA Take 1 capsule (75 mg total) by mouth 2 (two) times daily. Started by: Felix Pacini, DO   tiZANidine 4 MG tablet Commonly known as: Zanaflex Take 1 tablet (4 mg total) by mouth 3 (three) times daily.   traMADol 50 MG tablet Commonly known as: ULTRAM Take by mouth every 6 (six) hours as needed.        All past medical history, surgical history, allergies, family history, immunizations andmedications were updated in the EMR today and reviewed under the history and medication portions of their EMR.     ROS Negative, with the exception of above mentioned in HPI  Objective:  BP 107/69   Pulse 72   Temp (!) 97.5 F (36.4 C)   Wt 187 lb 9.6 oz (85.1 kg)   SpO2 95%   BMI 29.38 kg/m  Body mass index is 29.38 kg/m. Physical Exam Vitals and nursing note reviewed.  Constitutional:      General: She is not in acute distress.    Appearance: Normal appearance. She is not ill-appearing, toxic-appearing or diaphoretic.  HENT:     Head: Normocephalic and atraumatic.  Eyes:     General: No scleral icterus.       Right eye: No discharge.        Left eye: No discharge.     Extraocular Movements: Extraocular movements intact.     Conjunctiva/sclera: Conjunctivae normal.     Pupils: Pupils are equal, round, and reactive to light.  Musculoskeletal:     Lumbar back: Spasms and tenderness present. No swelling, deformity or bony tenderness. Decreased range of motion. Negative right straight leg raise test and negative left straight leg raise test.     Right knee: Effusion and crepitus present. No erythema. Normal range of motion. Tenderness present.     Left knee: Effusion and crepitus present. No erythema. Normal range of motion. Tenderness present.     Right lower leg: No edema.      Left lower leg: No edema.     Comments: Discomfort with side bending range of motion lumbar spine Muscle strength 5/5 bilateral lower extremity with the exception of 4+/5 left hip flexion with discomfort. 4+plantar/dorsiflexion left  FABRE: Right positive for SI discomfort.  Left discomfort inferior lateral  hip. Tender to palpation left SI. Decreased sensation left foot/plantar aspect. Vascularly intact distally.   Skin:    General: Skin is warm.     Findings: No rash.  Neurological:     Mental Status: She is alert and oriented to person, place, and time. Mental status is at baseline.     Motor: No weakness.     Gait: Gait normal.  Psychiatric:        Mood and Affect: Mood normal.        Behavior: Behavior normal.        Thought Content: Thought content normal.        Judgment: Judgment normal.      No results found. No results found. No results found for this or any previous visit (from the past 24 hour(s)).  Assessment/Plan: Danielle Kelly is a 52 y.o. female present for OV for  Back pain of lumbar region with radiculopathy/neuro deficit X-ray suggestive of osteoarthritis L5-S1 of lumbar spine with facet joint arthritis mainly on the left/degenerative disc disease L5-S1 Progressing lumbar pain with radiculopathy, now with neurodeficits/muscle weakness and numbness of the left foot despite conservative treatment, onset of symptoms greater than 6 weeks - MR LUMBAR SPINE W WO CONTRAST; Future-ASAP.  Patient now having muscle weakness and numbness in left foot after completing prednisone. - Ambulatory referral to Orthopedic Surgery -Continue diclofenac -Continue Zanaflex as needed. 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 Discussed self-management of symptoms. Outlined signs and symptoms indicating need for more acute  intervention. Patient verbalized understanding and all questions were answered. Patient received an After-Visit Summary.    Orders Placed This Encounter  Procedures   MR LUMBAR SPINE W WO CONTRAST   Meds ordered this encounter  Medications   pregabalin (LYRICA) 75 MG capsule    Sig: Take 1 capsule (75 mg total) by mouth 2 (two) times daily.    Dispense:  60 capsule    Refill:  5   Referral Orders  No referral(s) requested today     Note is dictated utilizing voice recognition software. Although note has been proof read prior to signing, occasional typographical errors still can be missed. If any questions arise, please do not hesitate to call for verification.   electronically signed by:  Felix Pacini, DO  [facility] [place]

## 2023-01-20 NOTE — Patient Instructions (Signed)
No follow-ups on file.        Great to see you today.  I have refilled the medication(s) we provide.   If labs were collected, we will inform you of lab results once received either by echart message or telephone call.   - echart message- for normal results that have been seen by the patient already.   - telephone call: abnormal results or if patient has not viewed results in their echart.  

## 2023-01-25 ENCOUNTER — Encounter: Payer: Self-pay | Admitting: Family Medicine

## 2023-01-27 ENCOUNTER — Encounter: Payer: Self-pay | Admitting: Family Medicine

## 2023-02-04 ENCOUNTER — Ambulatory Visit
Admission: RE | Admit: 2023-02-04 | Discharge: 2023-02-04 | Disposition: A | Discharge status: Home / Self Care | Payer: 59 | Source: Ambulatory Visit | Attending: Family Medicine | Admitting: Family Medicine

## 2023-02-04 DIAGNOSIS — M5416 Radiculopathy, lumbar region: Secondary | ICD-10-CM

## 2023-02-04 DIAGNOSIS — R29818 Other symptoms and signs involving the nervous system: Secondary | ICD-10-CM

## 2023-02-04 DIAGNOSIS — M5137 Other intervertebral disc degeneration, lumbosacral region: Secondary | ICD-10-CM

## 2023-02-04 DIAGNOSIS — M47817 Spondylosis without myelopathy or radiculopathy, lumbosacral region: Secondary | ICD-10-CM

## 2023-02-04 DIAGNOSIS — M545 Low back pain, unspecified: Secondary | ICD-10-CM

## 2023-02-04 MED ORDER — GADOPICLENOL 0.5 MMOL/ML IV SOLN
9.0000 mL | Freq: Once | INTRAVENOUS | Status: AC | PRN
  Administered 2023-02-04: 9 mL via INTRAVENOUS

## 2023-02-11 ENCOUNTER — Telehealth: Payer: Self-pay | Admitting: Family Medicine

## 2023-02-11 NOTE — Telephone Encounter (Signed)
Spoke with patient regarding results/recommendations. Pt has not been contacted by [employer]. She says whatever you think is best is what she will be because she is in a lot of pain

## 2023-02-11 NOTE — Telephone Encounter (Signed)
Please inform patient her MRI results have returned.   She has a large left-sided disc extrusion at the level of the last lumbar spine and the sacrum.  This is pushing on the S1 nerve root.  This is what is causing her pain.  We need to get her to specialty team, did she hear back from [facility] and has she seen them yet? If she has not we can try to get her to neurosurgery if she would rather.  Both specialty teams perform surgical procedures on lumbar spine.

## 2023-02-12 NOTE — Telephone Encounter (Signed)
LM for pt to return call to discuss.  

## 2023-02-12 NOTE — Telephone Encounter (Signed)
Patient missed the call, but please give the patient a call back as soon as possible.

## 2023-02-12 NOTE — Telephone Encounter (Signed)
Patient returned call to [facility] from 1:15PM today (02/12/23) Patient aware referral will be changed to Urgent/Stat request per Dr. Claiborne Billings.  Patient will await call from [facility] for appt.

## 2023-02-12 NOTE — Telephone Encounter (Signed)
We look into the referral and see what the hold-up is for [facility].  The referral was placed June 4, she definitely should have heard from them by now to schedule.  In addition to the information provided in previous referral, her MRI is now complete and resulted with  L5-S1  large left paracentral disc extrusion duration of disc material with mass effect on the left intraspinal S1 nerve root and a small component contacting the right intraspinal S1 nerve root.   Please switch the referral to urgent/emergent if needed and give them the above information.  Patient needs to be seen ASAP she is in significant pain.

## 2023-02-13 NOTE — Telephone Encounter (Signed)
Does not appear pt has had back surgery according to our record. LVM to confirm with pt is she has had any back surgeries.

## 2023-02-13 NOTE — Telephone Encounter (Signed)
Patient returned call.  Patient has had no back surgeries.  She rec'd call from [facility] also, she will be returning their call also.

## 2023-02-27 ENCOUNTER — Encounter: Payer: Self-pay | Admitting: Family Medicine

## 2023-04-28 ENCOUNTER — Telehealth: Payer: Self-pay

## 2023-04-28 NOTE — Telephone Encounter (Signed)
Spoke to pt and she was advised by Dr. Claiborne Billings to check with neurologist.

## 2023-04-28 NOTE — Telephone Encounter (Signed)
Patient is interested in genetic testing for alzheimer's. She states alzheimer's runs on mom and dad side of family.  Please call [phone]

## 2023-09-09 LAB — HM MAMMOGRAPHY

## 2023-09-09 LAB — HM PAP SMEAR

## 2023-09-09 LAB — RESULTS CONSOLE HPV: CHL HPV: NEGATIVE

## 2023-12-16 ENCOUNTER — Ambulatory Visit (INDEPENDENT_AMBULATORY_CARE_PROVIDER_SITE_OTHER): Payer: 59 | Admitting: Family Medicine

## 2023-12-16 ENCOUNTER — Encounter: Payer: Self-pay | Admitting: Family Medicine

## 2023-12-16 DIAGNOSIS — Z91199 Patient's noncompliance with other medical treatment and regimen due to unspecified reason: Secondary | ICD-10-CM

## 2023-12-16 DIAGNOSIS — Z0000 Encounter for general adult medical examination without abnormal findings: Secondary | ICD-10-CM

## 2023-12-16 DIAGNOSIS — Z23 Encounter for immunization: Secondary | ICD-10-CM

## 2023-12-16 DIAGNOSIS — E559 Vitamin D deficiency, unspecified: Secondary | ICD-10-CM

## 2023-12-16 DIAGNOSIS — E663 Overweight: Secondary | ICD-10-CM | POA: Insufficient documentation

## 2023-12-16 DIAGNOSIS — Z131 Encounter for screening for diabetes mellitus: Secondary | ICD-10-CM

## 2023-12-16 DIAGNOSIS — Z13220 Encounter for screening for lipoid disorders: Secondary | ICD-10-CM

## 2023-12-16 NOTE — Progress Notes (Signed)
No show cpe °

## 2023-12-16 NOTE — Patient Instructions (Addendum)

## 2024-06-21 ENCOUNTER — Other Ambulatory Visit (HOSPITAL_COMMUNITY): Payer: Self-pay | Admitting: Student

## 2024-06-21 DIAGNOSIS — M5416 Radiculopathy, lumbar region: Secondary | ICD-10-CM

## 2024-06-27 ENCOUNTER — Ambulatory Visit (HOSPITAL_COMMUNITY)
Admission: RE | Admit: 2024-06-27 | Discharge: 2024-06-27 | Disposition: A | Discharge status: Home / Self Care | Source: Ambulatory Visit | Attending: Student | Admitting: Student

## 2024-06-27 DIAGNOSIS — M5416 Radiculopathy, lumbar region: Secondary | ICD-10-CM | POA: Insufficient documentation
